# Patient Record
Sex: Male | Born: 1985 | Race: White | Hispanic: No | Marital: Single | Smoking: Current every day smoker
Health system: Southern US, Community
[De-identification: ages and names within clinical notes are randomized; demographics above are authoritative.]

## PROBLEM LIST (undated history)

## (undated) DIAGNOSIS — F1010 Alcohol abuse, uncomplicated: Secondary | ICD-10-CM

## (undated) DIAGNOSIS — J45909 Unspecified asthma, uncomplicated: Secondary | ICD-10-CM

## (undated) DIAGNOSIS — F1910 Other psychoactive substance abuse, uncomplicated: Secondary | ICD-10-CM

## (undated) DIAGNOSIS — E162 Hypoglycemia, unspecified: Secondary | ICD-10-CM

---

## 2010-04-12 LAB — POC CHEM8
Anion gap (POC): 17 mmol/L — ABNORMAL HIGH (ref 5–15)
BUN (POC): 4 MG/DL — ABNORMAL LOW (ref 9–20)
CO2 (POC): 22 MMOL/L (ref 21–32)
Calcium, ionized (POC): 1.18 MMOL/L (ref 1.12–1.32)
Chloride (POC): 106 MMOL/L (ref 98–107)
Creatinine (POC): 0.9 MG/DL (ref 0.6–1.3)
GFRAA, POC: >60 mL/min/{1.73_m2} (ref >60)
GFRNA, POC: >60 mL/min/{1.73_m2} (ref >60)
Glucose (POC): 94 MG/DL (ref 75–110)
Hematocrit (POC): 45 % (ref 36.6–50.3)
Hemoglobin (POC): 15.3 GM/DL (ref 12.1–17.0)
Potassium (POC): 3.7 MMOL/L (ref 3.5–5.1)
Sodium (POC): 140 MMOL/L (ref 137–145)

## 2010-04-12 LAB — CBC WITH AUTOMATED DIFF
ABS. BASOPHILS: 0.1 10*3/uL (ref 0.0–0.1)
ABS. EOSINOPHILS: 0.1 10*3/uL (ref 0.0–0.4)
ABS. LYMPHOCYTES: 1.5 10*3/uL (ref 0.8–3.5)
ABS. MONOCYTES: 0.5 10*3/uL (ref 0.0–1.0)
ABS. NEUTROPHILS: 2 10*3/uL (ref 1.8–8.0)
BASOPHILS: 2 % — ABNORMAL HIGH (ref 0–1)
EOSINOPHILS: 3 % (ref 0–7)
HCT: 42.6 % (ref 36.6–50.3)
HGB: 14.8 g/dL (ref 12.1–17.0)
LYMPHOCYTES: 37 % (ref 12–49)
MCH: 34.3 PG — ABNORMAL HIGH (ref 26.0–34.0)
MCHC: 34.7 g/dL (ref 30.0–36.5)
MCV: 98.6 FL (ref 80.0–99.0)
MONOCYTES: 12 % (ref 5–13)
NEUTROPHILS: 46 % (ref 32–75)
PLATELET: 281 10*3/uL (ref 150–400)
RBC: 4.32 M/uL (ref 4.10–5.70)
RDW: 13 % (ref 11.5–14.5)
WBC: 4.2 10*3/uL (ref 4.1–11.1)

## 2010-04-12 MED ORDER — ONDANSETRON 8 MG TAB, RAPID DISSOLVE
8 mg | ORAL_TABLET | Freq: Three times a day (TID) | ORAL | Status: AC | PRN
Start: 2010-04-12 — End: ?

## 2010-04-12 MED ADMIN — sodium chloride 0.9 % bolus infusion 1,000 mL: INTRAVENOUS | @ 15:00:00 | NDC 00409798348

## 2010-04-12 NOTE — ED Notes (Signed)
Verbal report received from Lindley, RN. Assumed patient care at this time.

## 2010-04-12 NOTE — ED Notes (Signed)
Patient verbalizes understanding of discharge instructions. Ambulatory and in no acute distress at discharge.
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
## 2010-04-13 LAB — EKG, 12 LEAD, INITIAL
Atrial Rate: 78 {beats}/min
Calculated P Axis: 57 degrees
Calculated R Axis: 82 degrees
Calculated T Axis: 55 degrees
Diagnosis: NORMAL
P-R Interval: 124 ms
Q-T Interval: 364 ms
QRS Duration: 100 ms
QTC Calculation (Bezet): 414 ms
Ventricular Rate: 78 {beats}/min

## 2012-05-04 ENCOUNTER — Other Ambulatory Visit: Payer: Self-pay | Admitting: Student

## 2012-05-04 ENCOUNTER — Ambulatory Visit
Admission: RE | Admit: 2012-05-04 | Discharge: 2012-05-04 | Disposition: A | Discharge status: Home / Self Care | Payer: No Typology Code available for payment source | Source: Ambulatory Visit | Attending: Student | Admitting: Student

## 2012-05-04 DIAGNOSIS — R7611 Nonspecific reaction to tuberculin skin test without active tuberculosis: Secondary | ICD-10-CM

## 2013-08-06 ENCOUNTER — Emergency Department (HOSPITAL_COMMUNITY)
Admission: EM | Admit: 2013-08-06 | Discharge: 2013-08-06 | Discharge status: Against Medical Advice | Payer: No Typology Code available for payment source

## 2013-08-06 ENCOUNTER — Emergency Department (HOSPITAL_COMMUNITY)
Admission: EM | Admit: 2013-08-06 | Discharge: 2013-08-07 | Disposition: A | Discharge status: Home / Self Care | Payer: Self-pay | Attending: Emergency Medicine | Admitting: Emergency Medicine

## 2013-08-06 ENCOUNTER — Encounter (HOSPITAL_COMMUNITY): Payer: Self-pay | Admitting: Emergency Medicine

## 2013-08-06 DIAGNOSIS — S20219A Contusion of unspecified front wall of thorax, initial encounter: Secondary | ICD-10-CM | POA: Insufficient documentation

## 2013-08-06 DIAGNOSIS — S8012XA Contusion of left lower leg, initial encounter: Secondary | ICD-10-CM

## 2013-08-06 DIAGNOSIS — S8010XA Contusion of unspecified lower leg, initial encounter: Secondary | ICD-10-CM | POA: Insufficient documentation

## 2013-08-06 DIAGNOSIS — F17200 Nicotine dependence, unspecified, uncomplicated: Secondary | ICD-10-CM | POA: Insufficient documentation

## 2013-08-06 DIAGNOSIS — R109 Unspecified abdominal pain: Secondary | ICD-10-CM | POA: Insufficient documentation

## 2013-08-06 DIAGNOSIS — S3981XA Other specified injuries of abdomen, initial encounter: Secondary | ICD-10-CM | POA: Insufficient documentation

## 2013-08-06 DIAGNOSIS — Z79899 Other long term (current) drug therapy: Secondary | ICD-10-CM | POA: Insufficient documentation

## 2013-08-06 DIAGNOSIS — S060X1A Concussion with loss of consciousness of 30 minutes or less, initial encounter: Secondary | ICD-10-CM | POA: Insufficient documentation

## 2013-08-06 DIAGNOSIS — Y9389 Activity, other specified: Secondary | ICD-10-CM | POA: Insufficient documentation

## 2013-08-06 DIAGNOSIS — S20212A Contusion of left front wall of thorax, initial encounter: Secondary | ICD-10-CM

## 2013-08-06 DIAGNOSIS — Y92410 Unspecified street and highway as the place of occurrence of the external cause: Secondary | ICD-10-CM | POA: Insufficient documentation

## 2013-08-06 DIAGNOSIS — J45909 Unspecified asthma, uncomplicated: Secondary | ICD-10-CM | POA: Insufficient documentation

## 2013-08-06 HISTORY — DX: Unspecified asthma, uncomplicated: J45.909
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
## 2013-08-07 ENCOUNTER — Encounter (HOSPITAL_COMMUNITY): Payer: Self-pay

## 2013-08-07 ENCOUNTER — Emergency Department (HOSPITAL_COMMUNITY): Payer: No Typology Code available for payment source

## 2013-08-07 ENCOUNTER — Emergency Department (HOSPITAL_COMMUNITY): Payer: Self-pay

## 2013-08-07 LAB — CBC WITH DIFFERENTIAL/PLATELET
BASOS PCT: 0 % (ref 0–1)
Basophils Absolute: 0 10*3/uL (ref 0.0–0.1)
EOS ABS: 0.1 10*3/uL (ref 0.0–0.7)
Eosinophils Relative: 1 % (ref 0–5)
HCT: 45.5 % (ref 39.0–52.0)
HEMOGLOBIN: 15.8 g/dL (ref 13.0–17.0)
Lymphocytes Relative: 15 % (ref 12–46)
Lymphs Abs: 1.7 10*3/uL (ref 0.7–4.0)
MCH: 34 pg (ref 26.0–34.0)
MCHC: 34.7 g/dL (ref 30.0–36.0)
MCV: 97.8 fL (ref 78.0–100.0)
MONOS PCT: 9 % (ref 3–12)
Monocytes Absolute: 1 10*3/uL (ref 0.1–1.0)
NEUTROS PCT: 75 % (ref 43–77)
Neutro Abs: 8.4 10*3/uL — ABNORMAL HIGH (ref 1.7–7.7)
PLATELETS: 273 10*3/uL (ref 150–400)
RBC: 4.65 MIL/uL (ref 4.22–5.81)
RDW: 12.6 % (ref 11.5–15.5)
WBC: 11.2 10*3/uL — ABNORMAL HIGH (ref 4.0–10.5)

## 2013-08-07 LAB — COMPREHENSIVE METABOLIC PANEL
ALBUMIN: 4.2 g/dL (ref 3.5–5.2)
ALK PHOS: 80 U/L (ref 39–117)
ALT: 42 U/L (ref 0–53)
ANION GAP: 16 — AB (ref 5–15)
AST: 69 U/L — ABNORMAL HIGH (ref 0–37)
BUN: 6 mg/dL (ref 6–23)
CO2: 22 mEq/L (ref 19–32)
Calcium: 9.2 mg/dL (ref 8.4–10.5)
Chloride: 104 mEq/L (ref 96–112)
Creatinine, Ser: 0.67 mg/dL (ref 0.50–1.35)
GFR calc Af Amer: >90 mL/min (ref >90)
GFR calc non Af Amer: >90 mL/min (ref >90)
Glucose, Bld: 116 mg/dL — ABNORMAL HIGH (ref 70–99)
POTASSIUM: 3.7 meq/L (ref 3.7–5.3)
Sodium: 142 mEq/L (ref 137–147)
Total Bilirubin: 0.4 mg/dL (ref 0.3–1.2)
Total Protein: 8.1 g/dL (ref 6.0–8.3)

## 2013-08-07 MED ORDER — METHOCARBAMOL 500 MG PO TABS: 500.0000 mg | ORAL_TABLET | Freq: Three times a day (TID) | ORAL | Status: AC | PRN

## 2013-08-07 MED ORDER — IBUPROFEN 600 MG PO TABS: 600.0000 mg | ORAL_TABLET | Freq: Four times a day (QID) | ORAL | Status: AC | PRN

## 2013-08-07 MED ORDER — MORPHINE SULFATE 4 MG/ML IJ SOLN
4.0000 mg | Freq: Once | INTRAMUSCULAR | Status: AC
  Administered 2013-08-07: 4 mg via INTRAVENOUS
  Filled 2013-08-07: qty 1

## 2013-08-07 MED ORDER — HYDROCODONE-ACETAMINOPHEN 5-325 MG PO TABS: 1.0000 | ORAL_TABLET | ORAL | Status: AC | PRN

## 2013-08-07 MED ORDER — SODIUM CHLORIDE 0.9 % IV BOLUS (SEPSIS)
1000.0000 mL | Freq: Once | INTRAVENOUS | Status: AC
  Administered 2013-08-07: 1000 mL via INTRAVENOUS

## 2013-08-07 MED ORDER — IOHEXOL 300 MG/ML  SOLN
100.0000 mL | Freq: Once | INTRAMUSCULAR | Status: AC | PRN
  Administered 2013-08-07: 100 mL via INTRAVENOUS
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
## 2013-08-07 NOTE — ED Notes (Signed)
Patient transported to X-ray 

## 2013-08-07 NOTE — ED Notes (Signed)
Dr Yelverton at bedside.  
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
## 2014-04-15 ENCOUNTER — Encounter (HOSPITAL_COMMUNITY): Payer: Self-pay | Admitting: Emergency Medicine

## 2014-04-15 ENCOUNTER — Emergency Department (HOSPITAL_COMMUNITY)
Admission: EM | Admit: 2014-04-15 | Discharge: 2014-04-15 | Disposition: A | Discharge status: Home / Self Care | Payer: Self-pay | Attending: Emergency Medicine | Admitting: Emergency Medicine

## 2014-04-15 DIAGNOSIS — Z79899 Other long term (current) drug therapy: Secondary | ICD-10-CM | POA: Insufficient documentation

## 2014-04-15 DIAGNOSIS — F10929 Alcohol use, unspecified with intoxication, unspecified: Secondary | ICD-10-CM

## 2014-04-15 DIAGNOSIS — T424X1A Poisoning by benzodiazepines, accidental (unintentional), initial encounter: Secondary | ICD-10-CM | POA: Insufficient documentation

## 2014-04-15 DIAGNOSIS — F10129 Alcohol abuse with intoxication, unspecified: Secondary | ICD-10-CM | POA: Insufficient documentation

## 2014-04-15 DIAGNOSIS — J45909 Unspecified asthma, uncomplicated: Secondary | ICD-10-CM | POA: Insufficient documentation

## 2014-04-15 DIAGNOSIS — Z720 Tobacco use: Secondary | ICD-10-CM | POA: Insufficient documentation

## 2014-04-15 LAB — RAPID URINE DRUG SCREEN, HOSP PERFORMED
AMPHETAMINES: NOT DETECTED
BARBITURATES: NOT DETECTED
Benzodiazepines: POSITIVE — AB
COCAINE: NOT DETECTED
Opiates: NOT DETECTED
Tetrahydrocannabinol: POSITIVE — AB

## 2014-04-15 LAB — ETHANOL: Alcohol, Ethyl (B): 211 mg/dL — ABNORMAL HIGH (ref 0–9)

## 2014-04-15 LAB — CBC
HCT: 45.7 % (ref 39.0–52.0)
HEMOGLOBIN: 16.1 g/dL (ref 13.0–17.0)
MCH: 33.6 pg (ref 26.0–34.0)
MCHC: 35.2 g/dL (ref 30.0–36.0)
MCV: 95.4 fL (ref 78.0–100.0)
Platelets: 320 10*3/uL (ref 150–400)
RBC: 4.79 MIL/uL (ref 4.22–5.81)
RDW: 12 % (ref 11.5–15.5)
WBC: 5.4 10*3/uL (ref 4.0–10.5)

## 2014-04-15 LAB — COMPREHENSIVE METABOLIC PANEL
ALT: 18 U/L (ref 0–53)
AST: 22 U/L (ref 0–37)
Albumin: 4 g/dL (ref 3.5–5.2)
Alkaline Phosphatase: 62 U/L (ref 39–117)
Anion gap: 11 (ref 5–15)
CALCIUM: 9 mg/dL (ref 8.4–10.5)
CO2: 23 mmol/L (ref 19–32)
CREATININE: 0.71 mg/dL (ref 0.50–1.35)
Chloride: 110 mmol/L (ref 96–112)
GFR calc Af Amer: >90 mL/min (ref >90)
GFR calc non Af Amer: >90 mL/min (ref >90)
GLUCOSE: 104 mg/dL — AB (ref 70–99)
POTASSIUM: 3.6 mmol/L (ref 3.5–5.1)
Sodium: 144 mmol/L (ref 135–145)
Total Bilirubin: 0.6 mg/dL (ref 0.3–1.2)
Total Protein: 7.4 g/dL (ref 6.0–8.3)

## 2014-04-15 LAB — SALICYLATE LEVEL: Salicylate Lvl: <4 mg/dL (ref 2.8–20.0)

## 2014-04-15 LAB — ACETAMINOPHEN LEVEL: Acetaminophen (Tylenol), Serum: <10 ug/mL — ABNORMAL LOW (ref 10–30)

## 2014-04-15 MED ORDER — NALOXONE HCL 1 MG/ML IJ SOLN
1.0000 mg | Freq: Once | INTRAMUSCULAR | Status: AC
  Administered 2014-04-15: 2 mg via INTRAVENOUS
  Administered 2014-04-15: 1 mg via INTRAVENOUS

## 2014-04-15 MED ORDER — NALOXONE HCL 1 MG/ML IJ SOLN
INTRAMUSCULAR | Status: AC
  Filled 2014-04-15: qty 2

## 2014-04-15 MED ORDER — NALOXONE HCL 1 MG/ML IJ SOLN
1.0000 mg | Freq: Once | INTRAMUSCULAR | Status: AC
  Administered 2014-04-15: 1 mg via INTRAVENOUS

## 2014-04-15 MED ORDER — NALOXONE HCL 1 MG/ML IJ SOLN
INTRAMUSCULAR | Status: AC
  Filled 2014-04-15: qty 4

## 2014-04-15 MED ORDER — NALOXONE HCL 1 MG/ML IJ SOLN
2.0000 mg | Freq: Once | INTRAMUSCULAR | Status: AC
  Administered 2014-04-15: 2 mg via INTRAVENOUS

## 2014-04-15 MED ORDER — NALOXONE HCL 1 MG/ML IJ SOLN
2.0000 mg | Freq: Once | INTRAMUSCULAR | Status: AC
  Administered 2014-04-15: 2 mg via INTRAVENOUS
  Filled 2014-04-15: qty 2

## 2014-04-15 MED ORDER — NALOXONE HCL 1 MG/ML IJ SOLN
INTRAMUSCULAR | Status: AC
  Administered 2014-04-15: 2 mg via INTRAVENOUS
  Filled 2014-04-15: qty 2

## 2014-04-15 NOTE — ED Notes (Signed)
Pt states that he has a ride home and that they should be here in approx 20 minutes.

## 2014-04-15 NOTE — Discharge Instructions (Signed)
Accidental Overdose °A drug overdose occurs when a chemical substance (drug or medication) is used in amounts large enough to overcome a person. This may result in severe illness or death. This is a type of poisoning. Accidental overdoses of medications or other substances come from a variety of reasons. When this happens accidentally, it is often because the person taking the substance does not know enough about what they have taken. Drugs which commonly cause overdose deaths are alcohol, psychotropic medications (medications which affect the mind), pain medications, illegal drugs (street drugs) such as cocaine and heroin, and multiple drugs taken at the same time. It may result from careless behavior (such as over-indulging at a party). Other causes of overdose may include multiple drug use, a lapse in memory, or drug use after a period of no drug use.  °Sometimes overdosing occurs because a person cannot remember if they have taken their medication.  °A common unintentional overdose in young children involves multi-vitamins containing iron. Iron is a part of the hemoglobin molecule in blood. It is used to transport oxygen to living cells. When taken in small amounts, iron allows the body to restock hemoglobin. In large amounts, it causes problems in the body. If this overdose is not treated, it can lead to death. °Never take medicines that show signs of tampering or do not seem quite right. Never take medicines in the dark or in poor lighting. Read the label and check each dose of medicine before you take it. When adults are poisoned, it happens most often through carelessness or lack of information. Taking medicines in the dark or taking medicine prescribed for someone else to treat the same type of problem is a dangerous practice. °SYMPTOMS  °Symptoms of overdose depend on the medication and amount taken. They can vary from over-activity with stimulant over-dosage, to sleepiness from depressants such as  alcohol, narcotics and tranquilizers. Confusion, dizziness, nausea and vomiting may be present. If problems are severe enough coma and death may result. °DIAGNOSIS  °Diagnosis and management are generally straightforward if the drug is known. Otherwise it is more difficult. At times, certain symptoms and signs exhibited by the patient, or blood tests, can reveal the drug in question.  °TREATMENT  °In an emergency department, most patients can be treated with supportive measures. Antidotes may be available if there has been an overdose of opioids or benzodiazepines. A rapid improvement will often occur if this is the cause of overdose. °At home or away from medical care: °· There may be no immediate problems or warning signs in children. °· Not everything works well in all cases of poisoning. °· Take immediate action. Poisons may act quickly. °· If you think someone has swallowed medicine or a household product, and the person is unconscious, having seizures (convulsions), or is not breathing, immediately call for an ambulance. °IF a person is conscious and appears to be doing OK but has swallowed a poison: °· Do not wait to see what effect the poison will have. Immediately call a poison control center (listed in the white pages of your telephone book under "Poison Control" or inside the front cover with other emergency numbers). Some poison control centers have TTY capability for the deaf. Check with your local center if you or someone in your family requires this service. °· Keep the container so you can read the label on the product for ingredients. °· Describe what, when, and how much was taken and the age and condition of the person poisoned.   Inform them if the person is vomiting, choking, drowsy, shows a change in color or temperature of skin, is conscious or unconscious, or is convulsing.  Do not cause vomiting unless instructed by medical personnel. Do not induce vomiting or force liquids into a person who  is convulsing, unconscious, or very drowsy. Stay calm and in control.   Activated charcoal also is sometimes used in certain types of poisoning and you may wish to add a supply to your emergency medicines. It is available without a prescription. Call a poison control center before using this medication. PREVENTION  Thousands of children die every year from unintentional poisoning. This may be from household chemicals, poisoning from carbon monoxide in a car, taking their parent's medications, or simply taking a few iron pills or vitamins with iron. Poisoning comes from unexpected sources.  Store medicines out of the sight and reach of children, preferably in a locked cabinet. Do not keep medications in a food cabinet. Always store your medicines in a secure place. Get rid of expired medications.  If you have children living with you or have them as occasional guests, you should have child-resistant caps on your medicine containers. Keep everything out of reach. Child proof your home.  If you are called to the telephone or to answer the door while you are taking a medicine, take the container with you or put the medicine out of the reach of small children.  Do not take your medication in front of children. Do not tell your child how good a medication is and how good it is for them. They may get the idea it is more of a treat.  If you are an adult and have accidentally taken an overdose, you need to consider how this happened and what can be done to prevent it from happening again. If this was from a street drug or alcohol, determine if there is a problem that needs addressing. If you are not sure a problems exists, it is easy to talk to a professional and ask them if they think you have a problem. It is better to handle this problem in this way before it happens again and has a much worse consequence. Document Released: 03/27/2004 Document Revised: 04/05/2011 Document Reviewed: 09/02/2008 [facility]  Patient Information 2015 [place], [place]. This information is not intended to replace advice given to you by your health care provider. Make sure you discuss any questions you have with your health care provider.  Alcohol Use Disorder Alcohol use disorder is a mental disorder. It is not a one-time incident of heavy drinking. Alcohol use disorder is the excessive and uncontrollable use of alcohol over time that leads to problems with functioning in one or more areas of daily living. People with this disorder risk harming themselves and others when they drink to excess. Alcohol use disorder also can cause other mental disorders, such as mood and anxiety disorders, and serious physical problems. People with alcohol use disorder often misuse other drugs.  Alcohol use disorder is common and widespread. Some people with this disorder drink alcohol to cope with or escape from negative life events. Others drink to relieve chronic pain or symptoms of mental illness. People with a family history of alcohol use disorder are at higher risk of losing control and using alcohol to excess.  SYMPTOMS  Signs and symptoms of alcohol use disorder may include the following:   Consumption ofalcohol inlarger amounts or over a longer period of time than intended.  Multiple unsuccessful  attempts to cutdown or control alcohol use.   A great deal of time spent obtaining alcohol, using alcohol, or recovering from the effects of alcohol (hangover).  A strong desire or urge to use alcohol (cravings).   Continued use of alcohol despite problems at work, school, or home because of alcohol use.   Continued use of alcohol despite problems in relationships because of alcohol use.  Continued use of alcohol in situations when it is physically hazardous, such as driving a car.  Continued use of alcohol despite awareness of a physical or psychological problem that is likely related to alcohol use. Physical problems related to  alcohol use can involve the brain, heart, liver, stomach, and intestines. Psychological problems related to alcohol use include intoxication, depression, anxiety, psychosis, delirium, and dementia.   The need for increased amounts of alcohol to achieve the same desired effect, or a decreased effect from the consumption of the same amount of alcohol (tolerance).  Withdrawal symptoms upon reducing or stopping alcohol use, or alcohol use to reduce or avoid withdrawal symptoms. Withdrawal symptoms include:  Racing heart.  Hand tremor.  Difficulty sleeping.  Nausea.  Vomiting.  Hallucinations.  Restlessness.  Seizures. DIAGNOSIS Alcohol use disorder is diagnosed through an assessment by your health care provider. Your health care provider may start by asking three or four questions to screen for excessive or problematic alcohol use. To confirm a diagnosis of alcohol use disorder, at least two symptoms must be present within a 5074-month period. The severity of alcohol use disorder depends on the number of symptoms:  Mild--two or three.  Moderate--four or five.  Severe--six or more. Your health care provider may perform a physical exam or use results from lab tests to see if you have physical problems resulting from alcohol use. Your health care provider may refer you to a mental health professional for evaluation. TREATMENT  Some people with alcohol use disorder are able to reduce their alcohol use to low-risk levels. Some people with alcohol use disorder need to quit drinking alcohol. When necessary, mental health professionals with specialized training in substance use treatment can help. Your health care provider can help you decide how severe your alcohol use disorder is and what type of treatment you need. The following forms of treatment are available:   Detoxification. Detoxification involves the use of prescription medicines to prevent alcohol withdrawal symptoms in the first week  after quitting. This is important for people with a history of symptoms of withdrawal and for heavy drinkers who are likely to have withdrawal symptoms. Alcohol withdrawal can be dangerous and, in severe cases, cause death. Detoxification is usually provided in a hospital or in-patient substance use treatment facility.  Counseling or talk therapy. Talk therapy is provided by substance use treatment counselors. It addresses the reasons people use alcohol and ways to keep them from drinking again. The goals of talk therapy are to help people with alcohol use disorder find healthy activities and ways to cope with life stress, to identify and avoid triggers for alcohol use, and to handle cravings, which can cause relapse.  Medicines.Different medicines can help treat alcohol use disorder through the following actions:  Decrease alcohol cravings.  Decrease the positive reward response felt from alcohol use.  Produce an uncomfortable physical reaction when alcohol is used (aversion therapy).  Support groups. Support groups are run by people who have quit drinking. They provide emotional support, advice, and guidance. These forms of treatment are often combined. Some people with alcohol  use disorder benefit from intensive combination treatment provided by specialized substance use treatment centers. Both inpatient and outpatient treatment programs are available. Document Released: 02/19/2004 Document Revised: 05/28/2013 Document Reviewed: 04/20/2012 [facility] Patient Information 2015 [place], [place]. This information is not intended to replace advice given to you by your health care provider. Make sure you discuss any questions you have with your health care provider. Substance Abuse Treatment Programs  Intensive Outpatient Programs [facility]     [address]      [place], [place]                   [phone]       [facility] [address] [place]  [address] [place], [place] [phone]  [facility]     (Inpatient and outpatient)     [address]           [phone]    [facility] [phone] (Suboxone and Methadone)  [address]      [place], [place] [address]      [phone]       [address] [place], [place] [phone]  [facility] (Outpatient/Inpatient, Chemical)    (insurance only) [phone]             [facility] (Groups & Residential) [place], [place] [phone]     [facility]     [address]     [place], [place]      [phone]       [facility] (for caregivers and family) [phone] [address] [place], [place] [phone]      Residential Treatment Programs [facility]      [address], [place], [place] [address]  [phone]       [facility] [address] [place], [place] [address] [phone]  [facility] [place]        [phone]       [facility] [phone]  [facility] ([facility]             [address]                                         [place], [place]                                                [phone] or [phone]                               [facility] [place] [address] [place], [address] [phone]  [facility]    [address]      [place], [place]     [phone]       [facility] [address] [place], [place] [phone]  [facility]   [address] [place], [place] [address]     [phone]      Admissions: 8am-3pm M-F  [facility] ([facility]) [address] [place], [place] [phone]  [facility]: Residential Program [phone] Days)   [place], [place]      [phone] or  [phone]     [facility]: [facility] [place], [place] (Walk in Hours over the weekend or by referral)  [facility] [address] [place], [place], [place] [address] [phone]  Crisis Mobile: [facility]:  [phone] (for crisis response 24 hours a day) [facility] Hotline:      [phone] Outpatient Psychiatry and Counseling  [facility]: Mobile Crisis Management 24 hours:  [phone]  [facility] of the [employer] sliding scale fee and walk in schedule: M-F 8am-12pm/1pm-3pm [address]  [place], [place] [address] [phone]  [facility] [address] [place], [place] [address] [phone]  [facility] ([facility])- new patient walk-in appointments available Monday - Friday 8am -3pm.          [address] [place], [place] [address] [phone] or crisis line- [phone]  [facility] Services/ Intensive Outpatient Therapy Program [address] [place], [place] [address] [phone]  [facility]                  Crisis Services      [phone] [address]     [place], [place] [address]                 [facility]   [facility] [phone]. [address] [place], [place] [address]   [employer] [employer] of Care          [address] [place]  [place], [place] [address]       [phone]  [facility] [address] [place], [place] [address] [phone]  [facility]    [address]    [place], [place] [address]     [phone]       Andee Poles, MD     [address]     [place] [address]     [phone]       [facility] [address] [place] [address]  [facility]     [address] [place], [place]      [phone]       [facility] Eulogio Ditch, MD [address] [place], [place]  [address] [phone]  [facility]     [address]     [place], [place] [address]     [phone]       [facility] [address] [place], [place] [address] [phone] Resources for Temporary Residential Assistance/Crisis Centers  DAY CENT[facility]) M-F 8am-3pm   [address] [place], [place] [address]   [phone] Services include: laundry, barbering, support groups, case management, phone  & computer access, showers, AA/NA mtgs, mental health/substance abuse nurse, job skills class, disability information, VA assistance, spiritual classes, etc.   HOMELESS SHELTERS  [facility]     [facility]   [address] [place]     [phone]              [employer] (women and children)       [address] [place], [place] [address] [phone] [email or web address] for application and process Application Required  [facility]   [address]    [place] [address]  [phone]                    [facility] [address] [place], [place] [address] [phone] application appt.) Application Required  [facility] (women only)    [address]     [place], [place] [address]     [phone]      Intake starts 6pm daily Need valid ID, SSC, & Police report [employer] [address] [place], [place] [phone] Application Required  [employer] (men only)     [address]      [place], [place]     [phone]       [facility] [place] (Pregnant women only) [address] [place], [place] [phone]  [facility]      [address]      [place], [place] [address]     [phone]             [facility] [address] [place], [place] [phone] 90 day commitment/SA/Application process  [facility](men only)     [address]     [place], [place]     [phone]       Check-in at [facility] [address] [place], [place] [address] [phone] Men/Women/Women and Children must be there by 7 pm  [facility] [place], [place] [phone]

## 2014-04-15 NOTE — ED Notes (Signed)
Pt is adamant about going home. Pt states that there is no one to pick him up from the hospital. This RN explained to the pt that it is not safe for him to go home by himself. Pt acknowledges.

## 2014-04-15 NOTE — ED Notes (Signed)
Rocky LinkKen, EMT at bedside.

## 2014-04-15 NOTE — ED Notes (Signed)
Pt has left with his ride home. Pt given discharge instructions, and had psychiatric services discussed.

## 2014-04-15 NOTE — ED Notes (Signed)
This RN ordered a regular, no sharps tray for the pt.  

## 2014-04-15 NOTE — ED Provider Notes (Signed)
CSN: [id number]     Arrival date & time 04/15/14  1637 History   First MD Initiated Contact with Patient 04/15/14 1639     Chief Complaint  Patient presents with  . Drug Overdose   Daniel Mcdonald is a 29 y.o. male who presents to the emergency department by EMS report he was found unresponsive by his roommate today. Upon my examination the patient is aroused by light touch and tells me he took 3 Xanax today. He also reports marijuana use. He denies any use of alcohol or other illicit substances today. He tells me he is upset as his brother is going to die soon. According to EMS is fair sick blood sugar was 79. He was given three rounds of narcan by EMS with minimal response. He is confused about where he is and what is going on today. He does deny trying to hurt himself today.  His mothers name is Johnnette Litter and her phone number is [phone].  Mother, who lives in [place], reports that she spoke with a roommate who reports there was a blister pack of Etizolam in his room.  Later I spoke with another roommate who tells me he drank alcohol and took several xanax today.   (Consider location/radiation/quality/duration/timing/severity/associated sxs/prior Treatment) HPI  Past Medical History  Diagnosis Date  . Asthma    History reviewed. No pertinent past surgical history. No family history on file. History  Substance Use Topics  . Smoking status: Current Every Day Smoker  . Smokeless tobacco: Not on file  . Alcohol Use: Yes    Review of Systems  Unable to perform ROS: Mental status change  Gastrointestinal: Negative for abdominal pain.  Psychiatric/Behavioral: Positive for dysphoric mood. Negative for suicidal ideas.      Allergies  Vancomycin  Home Medications   Prior to Admission medications   Medication Sig Start Date End Date Taking? Authorizing Provider  alprazolam Prudy Feeler) 2 MG tablet Take 2 mg by mouth 3 (three) times daily as needed for anxiety (FOR ptsd).   Yes  Historical Provider, MD  HYDROcodone-acetaminophen (NORCO) 5-325 MG per tablet Take 1-2 tablets by mouth every 4 (four) hours as needed for moderate pain. 08/07/13   Loren Racer, MD  ibuprofen (ADVIL,MOTRIN) 600 MG tablet Take 1 tablet (600 mg total) by mouth every 6 (six) hours as needed. 08/07/13   Loren Racer, MD  methocarbamol (ROBAXIN) 500 MG tablet Take 1 tablet (500 mg total) by mouth every 8 (eight) hours as needed for muscle spasms. 08/07/13   Loren Racer, MD  tetrahydrozoline 0.05 % ophthalmic solution Place 2 drops into both eyes daily.    Historical Provider, MD   BP 122/97 mmHg  Pulse 74  Temp(Src) 98.9 F (37.2 C) (Oral)  Resp 13  SpO2 98% Physical Exam  Constitutional: He appears well-developed and well-nourished. No distress.  Sleeping in room and aroused by light touch.   HENT:  Head: Normocephalic and atraumatic.  Right Ear: External ear normal.  Left Ear: External ear normal.  Nose: Nose normal.  Mouth/Throat: Oropharynx is clear and moist. No oropharyngeal exudate.  Bilateral tonsillar hypertrophy without exudate.   Eyes: Conjunctivae are normal. Pupils are equal, round, and reactive to light. Right eye exhibits no discharge. Left eye exhibits no discharge.  Pupils are not pinpoint.   Neck: Normal range of motion. Neck supple.  Cardiovascular: Normal rate, regular rhythm, normal heart sounds and intact distal pulses.  Exam reveals no gallop and no friction rub.   No  murmur heard. Pulmonary/Chest: Effort normal and breath sounds normal. No respiratory distress. He has no wheezes. He has no rales.  Abdominal: Soft. There is no tenderness.  Musculoskeletal: He exhibits no edema.  Lymphadenopathy:    He has no cervical adenopathy.  Neurological: He is alert. No cranial nerve deficit. Coordination normal.  Oriented to person only. Aroused by light touch and follows commands.   Skin: Skin is warm and dry. No rash noted. He is not diaphoretic. No erythema. No  pallor.  Psychiatric: He has a normal mood and affect. His behavior is normal.  Nursing note and vitals reviewed.   ED Course  Procedures (including critical care time) Labs Review Labs Reviewed  COMPREHENSIVE METABOLIC PANEL - Abnormal; Notable for the following:    Glucose, Bld 104 (*)    BUN <5 (*)    All other components within normal limits  ETHANOL - Abnormal; Notable for the following:    Alcohol, Ethyl (B) 211 (*)    All other components within normal limits  URINE RAPID DRUG SCREEN (HOSP PERFORMED) - Abnormal; Notable for the following:    Benzodiazepines POSITIVE (*)    Tetrahydrocannabinol POSITIVE (*)    All other components within normal limits  ACETAMINOPHEN LEVEL - Abnormal; Notable for the following:    Acetaminophen (Tylenol), Serum <10.0 (*)    All other components within normal limits  CBC  SALICYLATE LEVEL    Imaging Review No results found.   EKG Interpretation None     EKG:  Date: 04/16/2014 Rate: 78 Rhythm: normal sinus rhythm QRS Axis: normal Intervals: normal ST/T Wave abnormalities: ST elevations anteriorly, likely early repolarization pattern Conduction Disutrbances:none Narrative Interpretation:  Old EKG Reviewed: none available I have personally reviewed the EKG tracing and agree with the computerized printout as noted. Rolan BuccoMelanie Belfi, MD  Filed Vitals:   04/15/14 2030 04/15/14 2045 04/15/14 2149 04/15/14 2307  BP: 103/78 122/74 127/79 122/97  Pulse: 83 79 74   Temp:   98.9 F (37.2 C)   TempSrc:   Oral   Resp: 18 16 13    SpO2: 95% 96% 98%      MDM   Meds given in ED:  Medications  naloxone (NARCAN) injection 1 mg (2 mg Intravenous Given 04/15/14 1757)  naloxone (NARCAN) injection 1 mg (1 mg Intravenous Given 04/15/14 1745)  naloxone [facility](NARCAN) injection 2 mg (2 mg Intravenous Given 04/15/14 1749)  naloxone [facility](NARCAN) injection 2 mg (2 mg Intravenous Given 04/15/14 1800)  naloxone (NARCAN) injection 2 mg (2 mg Intravenous  Given 04/15/14 1815)  naloxone (NARCAN) injection 2 mg (2 mg Intravenous Given 04/15/14 1830)    Discharge Medication List as of 04/15/2014  9:57 PM      Final diagnoses:  Benzodiazepine (tranquilizer) overdose, accidental or unintentional, initial encounter  Alcohol intoxication, with unspecified complication   This 29 year old male who presented to the emergency room by EMS with altered mental status. EMS reports he was found by his roommate unresponsive on the floor. The patient was given 3 rounds of Narcan with minimal relief. Upon arrival to demonstrate the patient became responsive and at my initial evaluation the patient is arousable to light touch and tells me he took 3 xanax today, but denies any thoughts about wanting to hurt himself. His pupils are not pinpoint. He is in no respiratory distress and is not hypoxic. He is confused about where he is at my initial evaluation and appears intoxicated.  I spoke with his mother who is unsure what he  might have taken but did speak with the roommate who reports benzodiazepines. I also spoke with poison control about the specific benzodiazepine he might have taken who suggested supportive care, and blood work that had already been ordered.  Around 1745 I was notified that the patient became less arousable. The patient would no longer follow commands and his pupils are pinpoint. He is still tolerating his secretions and has a gag reflex. Tried several rounds of narcan without any change. He had 10 mg total of narcan without change. I placed a nasal trumpet and he quickly became more responsive.  Urine rapid drug screen was positive for benzodiazepines and THC. He has an alcohol level of 211. His CBC and CMP are unremarkable. His acetaminophen and salicylate level are negative. I spoke again with the patient when he was more awake and he is clearly still intoxicated. Will allow him to sober up and speak with him again. He denies SI or HI. This seems to be  due to use of benzodiazepines with alcohol.  When the patient had sobered up I spoke with the patient again who denies SI or HI. He reports he was in an argument with his roommate and went to the bar to drink. He reports taking benzodiazepines to "try and get high." But he did not intent to hurt himself. He reports some depression and history of PTSD, but denies SI. I suggested he stay and speak with behavioural health, but he refuses. I spoke to him about [facility] resources and provided him information in his discharge. He is alert and oriented at this time. He has no focal neuro deficits. He denies any complaint at this time. He is able to ambulate without difficulty or assistance. He does not want me to speak with his mother again.  After discussing with my attending we will allow him to be discharged to the care of his roommates who are at bedside. Strict return precautions are provided.  I advised the patient to follow-up with their primary care provider this week. I advised the patient to return to the emergency department with new or worsening symptoms or new concerns. The patient verbalized understanding and agreement with plan.   This patient was discussed with and evaluated by Dr. Fredderick Phenix who agrees with assessment and plan.   CRITICAL CARE Performed by: Lawana Chambers   Total critical care time: 60  Critical care time was exclusive of separately billable procedures and treating other patients.  Critical care was necessary to treat or prevent imminent or life-threatening deterioration.  Critical care was time spent personally by me on the following activities: development of treatment plan with patient and/or surrogate as well as nursing, discussions with consultants, evaluation of patient's response to treatment, examination of patient, obtaining history from patient or surrogate, ordering and performing treatments and interventions, ordering and review of laboratory studies, ordering and  review of radiographic studies, pulse oximetry and re-evaluation of patient's condition.    Everlene Farrier, PA-C 04/16/14 1610  Rolan Bucco, MD 04/16/14 1309

## 2014-04-15 NOTE — ED Notes (Addendum)
Pt reporting that he is missing a leather jacket and his wallet. This RN has not seen either of these items. Pt also reporting that he has lost his keys.

## 2014-04-15 NOTE — ED Notes (Addendum)
[employer]by, friends, at bedside. Report patient took "At least 7 Xanax Bars, a half of a fifth of liquor, and was drinking beer all morning." Dr. Fredderick PhenixBelfi made aware.

## 2014-04-15 NOTE — ED Notes (Signed)
Pt is awake. Pt heard shouting from the nurse's station. This RN went to check on the pt, and asked why he was yelling, and the pt stated "Because it's perfectly within my right to do so". Pt appears in no acute distress but is still confused. Pt asked this RN what her "rank" is.

## 2014-04-15 NOTE — ED Notes (Addendum)
Per EMS, pt was found by roommate unresponsive. Pts roommate reports that pt does do benzos and mariajuana. Pt was given narcan by the fire department with no response. Pt then given 2 more rounds of narcan by ems with minimal response. Pt now responds to voice but is confused. Pt has nasal trumpet in place.  BP:136/70, CBG:79

## 2014-04-15 NOTE — ED Notes (Signed)
Pt pulled out his nasal trumpet. Pt verbally abusive. Pt does not admit to drug use today, but his roommate reports he took xanax and marijuana. Pt confused when speaking to this RN. Pt states" we went through a seven year campaign together." Pt talking about war and the government.

## 2014-04-15 NOTE — ED Notes (Signed)
Will PA at bedside, inserting NPA.

## 2014-04-15 NOTE — ED Notes (Signed)
Pt is now reporting bilateral knee pain, worse with movement. Pt is requesting morphine for pain. This RN explained to the pt that he will be unable to have morphine, as it is a sedative, and he has already taken too much xanax.  Pt has taken all of the sheet off of his bed. Meal tray at bedside.

## 2014-04-15 NOTE — ED Notes (Signed)
This RN spoke with Alona BeneJoyce in poison control, who recommends that the pt should receive IV fluids and have an EKG performed.

## 2014-04-15 NOTE — ED Notes (Signed)
Patient self-extracated NPA.

## 2014-04-15 NOTE — ED Notes (Signed)
Pt has taken all of the vitals monitoring equipment off and has taken out his second IV. Will, PA at bedside.

## 2014-04-16 NOTE — ED Provider Notes (Signed)
ED ECG REPORT   Date: 04/16/2014  Rate: 78  Rhythm: normal sinus rhythm  QRS Axis: normal  Intervals: normal  ST/T Wave abnormalities: ST elevations anteriorly, likely early repolarization pattern  Conduction Disutrbances:none  Narrative Interpretation:   Old EKG Reviewed: none available  I have personally reviewed the EKG tracing and agree with the computerized printout as noted.   Rolan BuccoMelanie Tonette Koehne, MD 04/16/14 [phone]

## 2014-04-26 ENCOUNTER — Encounter (HOSPITAL_COMMUNITY): Payer: Self-pay | Admitting: *Deleted

## 2014-04-26 ENCOUNTER — Emergency Department (HOSPITAL_COMMUNITY)
Admission: EM | Admit: 2014-04-26 | Discharge: 2014-04-27 | Disposition: A | Discharge status: Home / Self Care | Payer: Self-pay | Attending: Emergency Medicine | Admitting: Emergency Medicine

## 2014-04-26 ENCOUNTER — Emergency Department (HOSPITAL_COMMUNITY): Payer: Self-pay

## 2014-04-26 DIAGNOSIS — J45909 Unspecified asthma, uncomplicated: Secondary | ICD-10-CM | POA: Insufficient documentation

## 2014-04-26 DIAGNOSIS — S01311A Laceration without foreign body of right ear, initial encounter: Secondary | ICD-10-CM | POA: Insufficient documentation

## 2014-04-26 DIAGNOSIS — Z23 Encounter for immunization: Secondary | ICD-10-CM | POA: Insufficient documentation

## 2014-04-26 DIAGNOSIS — S0990XA Unspecified injury of head, initial encounter: Secondary | ICD-10-CM | POA: Insufficient documentation

## 2014-04-26 DIAGNOSIS — Y998 Other external cause status: Secondary | ICD-10-CM | POA: Insufficient documentation

## 2014-04-26 DIAGNOSIS — Y92002 Bathroom of unspecified non-institutional (private) residence single-family (private) house as the place of occurrence of the external cause: Secondary | ICD-10-CM | POA: Insufficient documentation

## 2014-04-26 DIAGNOSIS — F1910 Other psychoactive substance abuse, uncomplicated: Secondary | ICD-10-CM

## 2014-04-26 DIAGNOSIS — F1210 Cannabis abuse, uncomplicated: Secondary | ICD-10-CM | POA: Insufficient documentation

## 2014-04-26 DIAGNOSIS — S0101XA Laceration without foreign body of scalp, initial encounter: Secondary | ICD-10-CM | POA: Insufficient documentation

## 2014-04-26 DIAGNOSIS — W1839XA Other fall on same level, initial encounter: Secondary | ICD-10-CM | POA: Insufficient documentation

## 2014-04-26 DIAGNOSIS — Y9389 Activity, other specified: Secondary | ICD-10-CM | POA: Insufficient documentation

## 2014-04-26 DIAGNOSIS — Z720 Tobacco use: Secondary | ICD-10-CM | POA: Insufficient documentation

## 2014-04-26 DIAGNOSIS — S0181XA Laceration without foreign body of other part of head, initial encounter: Secondary | ICD-10-CM

## 2014-04-26 HISTORY — DX: Hypoglycemia, unspecified: E16.2

## 2014-04-26 HISTORY — DX: Other psychoactive substance abuse, uncomplicated: F19.10

## 2014-04-26 HISTORY — DX: Alcohol abuse, uncomplicated: F10.10

## 2014-04-26 LAB — CBC WITH DIFFERENTIAL/PLATELET
BASOS ABS: 0.1 10*3/uL (ref 0.0–0.1)
BASOS PCT: 0 % (ref 0–1)
EOS ABS: 0 10*3/uL (ref 0.0–0.7)
Eosinophils Relative: 0 % (ref 0–5)
HCT: 46.9 % (ref 39.0–52.0)
HEMOGLOBIN: 17.1 g/dL — AB (ref 13.0–17.0)
LYMPHS PCT: 5 % — AB (ref 12–46)
Lymphs Abs: 1.2 10*3/uL (ref 0.7–4.0)
MCH: 34.2 pg — ABNORMAL HIGH (ref 26.0–34.0)
MCHC: 36.5 g/dL — ABNORMAL HIGH (ref 30.0–36.0)
MCV: 93.8 fL (ref 78.0–100.0)
MONO ABS: 1.4 10*3/uL — AB (ref 0.1–1.0)
Monocytes Relative: 6 % (ref 3–12)
NEUTROS ABS: 19.7 10*3/uL — AB (ref 1.7–7.7)
NEUTROS PCT: 89 % — AB (ref 43–77)
Platelets: 325 10*3/uL (ref 150–400)
RBC: 5 MIL/uL (ref 4.22–5.81)
RDW: 12.1 % (ref 11.5–15.5)
WBC: 22.3 10*3/uL — AB (ref 4.0–10.5)

## 2014-04-26 LAB — BASIC METABOLIC PANEL
ANION GAP: 8 (ref 5–15)
Anion gap: 21 — ABNORMAL HIGH (ref 5–15)
BUN: 8 mg/dL (ref 6–23)
BUN: 8 mg/dL (ref 6–23)
CALCIUM: 9.6 mg/dL (ref 8.4–10.5)
CHLORIDE: 100 mmol/L (ref 96–112)
CO2: 14 mmol/L — ABNORMAL LOW (ref 19–32)
CO2: 22 mmol/L (ref 19–32)
CREATININE: 0.93 mg/dL (ref 0.50–1.35)
Calcium: 8.5 mg/dL (ref 8.4–10.5)
Chloride: 95 mmol/L — ABNORMAL LOW (ref 96–112)
Creatinine, Ser: 1.26 mg/dL (ref 0.50–1.35)
GFR calc Af Amer: 89 mL/min — ABNORMAL LOW (ref >90)
GFR, EST NON AFRICAN AMERICAN: 77 mL/min — AB (ref >90)
GLUCOSE: 243 mg/dL — AB (ref 70–99)
Glucose, Bld: 141 mg/dL — ABNORMAL HIGH (ref 70–99)
POTASSIUM: 3.3 mmol/L — AB (ref 3.5–5.1)
Potassium: 3.2 mmol/L — ABNORMAL LOW (ref 3.5–5.1)
Sodium: 130 mmol/L — ABNORMAL LOW (ref 135–145)
Sodium: 130 mmol/L — ABNORMAL LOW (ref 135–145)

## 2014-04-26 LAB — I-STAT CG4 LACTIC ACID, ED
LACTIC ACID, VENOUS: 0.9 mmol/L (ref 0.5–2.0)
Lactic Acid, Venous: 2.77 mmol/L (ref 0.5–2.0)

## 2014-04-26 LAB — RAPID URINE DRUG SCREEN, HOSP PERFORMED
Amphetamines: NOT DETECTED
BARBITURATES: NOT DETECTED
BENZODIAZEPINES: NOT DETECTED
Cocaine: NOT DETECTED
Opiates: NOT DETECTED
Tetrahydrocannabinol: POSITIVE — AB

## 2014-04-26 LAB — CBG MONITORING, ED: Glucose-Capillary: 236 mg/dL — ABNORMAL HIGH (ref 70–99)

## 2014-04-26 LAB — TYPE AND SCREEN
ABO/RH(D): A POS
ANTIBODY SCREEN: NEGATIVE

## 2014-04-26 LAB — PROTIME-INR
INR: 1.1 (ref 0.00–1.49)
PROTHROMBIN TIME: 14.4 s (ref 11.6–15.2)

## 2014-04-26 LAB — ABO/RH: ABO/RH(D): A POS

## 2014-04-26 LAB — ETHANOL: Alcohol, Ethyl (B): <5 mg/dL (ref 0–9)

## 2014-04-26 LAB — SALICYLATE LEVEL

## 2014-04-26 LAB — ACETAMINOPHEN LEVEL

## 2014-04-26 MED ORDER — ACETAMINOPHEN 500 MG PO TABS
1000.0000 mg | ORAL_TABLET | Freq: Once | ORAL | Status: AC
  Administered 2014-04-26: 1000 mg via ORAL
  Filled 2014-04-26: qty 2

## 2014-04-26 MED ORDER — ONDANSETRON HCL 4 MG/2ML IJ SOLN
INTRAMUSCULAR | Status: AC
  Filled 2014-04-26: qty 2

## 2014-04-26 MED ORDER — ONDANSETRON HCL 4 MG/2ML IJ SOLN
4.0000 mg | Freq: Once | INTRAMUSCULAR | Status: AC
  Administered 2014-04-26: 4 mg via INTRAVENOUS

## 2014-04-26 MED ORDER — LIDOCAINE HCL (PF) 1 % IJ SOLN
5.0000 mL | Freq: Once | INTRAMUSCULAR | Status: AC
  Administered 2014-04-26: 5 mL
  Filled 2014-04-26: qty 5

## 2014-04-26 MED ORDER — SODIUM CHLORIDE 0.9 % IV BOLUS (SEPSIS)
2000.0000 mL | Freq: Once | INTRAVENOUS | Status: AC
  Administered 2014-04-26: 2000 mL via INTRAVENOUS

## 2014-04-26 MED ORDER — IBUPROFEN 400 MG PO TABS
600.0000 mg | ORAL_TABLET | Freq: Once | ORAL | Status: AC
  Administered 2014-04-26: 600 mg via ORAL
  Filled 2014-04-26 (×2): qty 1

## 2014-04-26 MED ORDER — OXYCODONE-ACETAMINOPHEN 5-325 MG PO TABS
1.0000 | ORAL_TABLET | Freq: Once | ORAL | Status: AC
  Administered 2014-04-26: 1 via ORAL
  Filled 2014-04-26: qty 1

## 2014-04-26 MED ORDER — SODIUM CHLORIDE 0.9 % IV BOLUS (SEPSIS)
1000.0000 mL | Freq: Once | INTRAVENOUS | Status: AC
  Administered 2014-04-26: 1000 mL via INTRAVENOUS

## 2014-04-26 MED ORDER — FENTANYL CITRATE 0.05 MG/ML IJ SOLN
50.0000 ug | Freq: Once | INTRAMUSCULAR | Status: AC
  Administered 2014-04-26: 50 ug via INTRAVENOUS
  Filled 2014-04-26: qty 2

## 2014-04-26 NOTE — Discharge Instructions (Signed)
°Emergency Department Resource Guide °1) Find a Doctor and Pay Out of Pocket °Although you won't have to find out who is covered by your insurance plan, it is a good idea to ask around and get recommendations. You will then need to call the office and see if the doctor you have chosen will accept you as a new patient and what types of options they offer for patients who are self-pay. Some doctors offer discounts or will set up payment plans for their patients who do not have insurance, but you will need to ask so you aren't surprised when you get to your appointment. ° °2) Contact Your Local Health Department °Not all health departments have doctors that can see patients for sick visits, but many do, so it is worth a call to see if yours does. If you don't know where your local health department is, you can check in your phone book. The CDC also has a tool to help you locate your state's health department, and many state websites also have listings of all of their local health departments. ° °3) Find a Walk-in Clinic °If your illness is not likely to be very severe or complicated, you may want to try a walk in clinic. These are popping up all over the country in pharmacies, drugstores, and shopping centers. They're usually staffed by nurse practitioners or physician assistants that have been trained to treat common illnesses and complaints. They're usually fairly quick and inexpensive. However, if you have serious medical issues or chronic medical problems, these are probably not your best option. ° °No Primary Care Doctor: °- Call [facility] at  832-8000 - they can help you locate a primary care doctor that  accepts your insurance, provides certain services, etc. °- Physician Referral Service- 1-800-533-3463 ° °Chronic Pain Problems: °Organization         Address  Phone   Notes  °[facility]  (336) 297-2271 Patients need to be referred by their primary care doctor.  ° °Medication  Assistance: °Organization         Address  Phone   Notes  °[place] [facility] [address] °[place], [place] [address] (336) 641-8030 --Must be a resident of [place] °-- Must have NO insurance coverage whatsoever (no Medicaid/ Medicare, etc.) °-- The pt. MUST have a primary care doctor that directs their care regularly and follows them in the community °  °[facility]  (866) 331-1348   °[employer]  (888) 892-1162   ° °Agencies that provide inexpensive medical care: °Organization         Address  Phone   Notes  °[facility]  (336) 832-8035   °[facility]    (336) 832-7272   °[facility] [address] °[place], [place] [address] (336) 832-4777   °[facility] [address], °[place] (336) 271-4999   °[facility]    (336) 373-0678   °[facility]    (336) 272-1050   °[facility] ° [address], [place] Phone:  (336) 832-4444, Fax:  (336) 832-4440 Hours of Operation:  9 am - 6 pm, M-F.  Also accepts Medicaid/Medicare and self-pay.  °[facility] ° [address], [place] Phone: (336) 832-3150, Fax: (336) 832-3151. Hours of Operation:  8:30 am - 5:30 pm, M-F.  Also accepts Medicaid and self-pay.  °[facility] [place] [address], [place] Phone: (336) 878-6027   °[facility] [address], [place], [place] (336)723-1848, Ext. 123 Mondays & Thursdays: 7-9 AM.  First 15 patients are seen on a first come, first serve basis. °  ° °Medicaid-accepting [place] Providers: ° °Organization         Address  Phone   Notes  °[facility] [address], [place] (336) 641-2100 Also accepts self-pay patients.  °[facility] [address], [place] ° (336) 856-9996   °[facility] [address], [place]  (336) 288-8857   °[facility] [address] [place] [address], [place] (336) 299-7000   °Veita Bland [address], [place]  ° (336) 373-1557 Only accepts [place] Access Medicaid patients after they have their name applied to their card.  ° °Self-Pay (no insurance) in [place]: ° °Organization         Address  Phone   Notes  °Sickle Cell Patients, [facility] [address], [place] (336) 832-1970   °[facility] [address], [place] (336) 832-4400   ° [facility] [facility] ° [address], [place] (336) 992-4800   °[facility]/Dr. Osei-Bonsu ° [address] [place] [address], [place] or [address], [place] (336) 841-8500 Phone number for both [place] and [place] locations is the same.  °[facility] [address], [place] (336) 299-0000   °[facility] [address] [place] [address], [place] or [address] (336) 852-7530 °(336) 878-2260   °[facility] [address], [place] (336) 350-1642, phone; (336) 294-5005, fax Sees patients 1st and 3rd Saturday of every month.  Must not qualify for public or private insurance (i.e. Medicaid, Medicare, [place] Health Choice, Veterans' Benefits) • Household income should be no more than 200% of the poverty level •The clinic cannot treat you if you are pregnant or think you are pregnant • Sexually transmitted diseases are not treated at the clinic.  ° ° °Dental Care: °Organization         Address  Phone  Notes  °[place] [facility] [address], [place] (336) 641-6152 Accepts children up to age 21 who are enrolled in Medicaid or [place] Health Choice; pregnant women with a Medicaid card; and children who have applied for Medicaid or [place] Health Choice, but were declined, whose parents can pay a reduced fee at time of service.  °[place]  [facility] [place]  [address], [place] (336) 641-7733 Accepts children up to age 21 who are enrolled in Medicaid or [place] Health Choice; pregnant women with a Medicaid card; and children who have applied for Medicaid or [place] Health Choice, but were declined, whose parents can pay a reduced fee at time of service.  °[facility] ° [address], [place] (336) 641-4533 Patients are seen by appointment only. Walk-ins are not accepted. [facility] will see patients 18 years of age and older. °Monday - Tuesday (8am-5pm) °Most Wednesdays (8:30-5pm) °$30 per visit, cash only  °[facility] ° [address], [place] (336) 641-4533 Patients are seen by appointment only. Walk-ins are not accepted. [facility] will see patients 18 years of age and older. °One   Wednesday Evening (Monthly: Volunteer Based).  $30 per visit, cash only  °[facility]  (919) 537-3737 for adults; Children under age 4, call Graduate Pediatric Dentistry at (919) [phone]. Children aged 4-14, please call (919) [phone] to request a pediatric application. ° Dental services are provided in all areas of dental care including fillings, crowns and bridges, complete and partial dentures, implants, gum treatment, root canals, and extractions. Preventive care is also provided. Treatment is provided to both adults and children. °Patients are selected via a lottery and there is often a waiting list. °  °[facility] [address], °[place] ° (336) 763-8833 [email or web address] °  °[facility] [address], [place], [place] (336)723-1848, Ext. 123 Second and Fourth Thursday of each month, opens at 6:30 AM; Clinic ends at 9 AM.  Patients are seen on a first-come first-served basis, and a limited number are seen during each clinic.  ° °[facility] ° [address], [place], [place] (336) 723-7904    Eligibility Requirements °You must have lived in [place], or [place] counties for at least the last three months. °  You cannot be eligible for state or federal sponsored healthcare insurance, including Veterans Administration, Medicaid, or Medicare. °  You generally cannot be eligible for healthcare insurance through your employer.  °  How to apply: °Eligibility screenings are held every Tuesday and Wednesday afternoon from 1:00 pm until 4:00 pm. You do not need an appointment for the interview!  °[facility] [address], [place], [place] 336-631-2330   °[facility]  336-342-8273   °[facility]  336-703-3100   °[facility]  336-570-6415   ° °Behavioral Health Resources in the Community: °Intensive Outpatient Programs °Organization         Address  Phone  Notes  °[facility] [address], [place], [place] 336-878-6098   °[facility] [address], [place], [place] 336-832-9800   °[facility] [address], [place], [place] ° 336-882-2125   °[facility] [address],  °[place], [place] 1-800-853-5163 or 336-641-4981   °Substance Abuse Resources °Organization         Address  Phone  Notes  °[facility]  336-882-2125   °[facility]  336-784-9470   °[facility]  336-285-9073   °[facility]  336-845-3988   °Residential & Outpatient Substance Abuse Program  1-800-659-3381   °Psychological Services °Organization         Address  Phone  Notes  °[facility]  336- 832-9600   °[facility]  336- 378-7881   °[facility] [address], [place] 1-800-853-5163 or 336-641-4981   ° °Mobile Crisis Teams °Organization         Address  Phone  Notes  °[facility], Mobile Crisis Care Unit  1-877-626-1772   °Assertive °[facility] ° [address]  [place], [place] 336-834-9664   °Sharon DeEsch [address] °[place] 336-554-5454   ° °Self-Help/Support Groups °Organization         Address  Phone             Notes  °[facility]  - variety of support groups  336- 373-1402 Call for more information  °Narcotics Anonymous (NA), [facility] [address], °[place]  2 meetings at this location  ° °  Residential Treatment Programs °Organization         Address  Phone  Notes  °[facility] [address],    °[place]  1-866-801-8205   °[facility] ° [address], [place], [place] 704-293-8524   °[facility] [address], [place] 336-845-3988 Admissions: 8am-3pm M-F  °[facility] [address]    °[place], [place] 336-841-1104   °[facility] [address], [place], [place] 336-379-7146   °[facility] [address]  °[place], [place] 336-285-9073   °[facility] Intensive Outpatient [address], [place], [place] 336-852-3033   °[facility] [address]  °[place], [place] 1-877-615-2722 or 336-784-9470   °[facility] [address] [place], [place] 336-227-7417 Accepts Medicaid  °[facility] [address]  °[place] 1-800-659-3381 Substance Abuse/Addiction Treatment  ° °[place] Behavioral Health Resources °Organization         Address  Phone  Notes  °[facility]  (888) 581-9988   °Julie Brannon, PhD [address] [place], [place]   (336) 349-5553 or (336) 951-0000   °[facility]   [address] °[place], [place] (336) 349-4454   °[facility] [address], [place], [place] (336) 342-8316 Insurance/Medicaid/sponsorship through [facility]  °[facility] [address]                                    [place], [place] (336) 342-8316 Therapy/tele-psych/case    °[facility] [address]  ° [place], [place] (336) 349-2233    °Dr. Arfeen  (336) 349-4544   °[facility] [place]  [employer] [place] [facility] 1) [address], [place] °2) [address], [place] °3)  [address], [place] (336) 349-3220 °(336) 342-7768 ° °(336) 342-8140   °[place] [facility] (336) 342-1394 or (336) 342-3537 (After Hours)    ° ° °

## 2014-04-26 NOTE — ED Notes (Signed)
[facility] 236.Marland Kitchen.RN notified

## 2014-04-26 NOTE — ED Notes (Signed)
Pt reports wanting assistance with "my drinking problem."  Notified Dr Durward FortesNickle who spoke with pt at bedside.

## 2014-04-26 NOTE — Progress Notes (Signed)
RT responded to level 2 trauma. Pt on room air with spo2 96%. Denies SOB. RT will continue to monitor.

## 2014-04-26 NOTE — Progress Notes (Signed)
Chaplain Note:  Chaplain called pt mother at [phone].   Mother reported that this is the 2nd time he's been in a hospital in 2 weeks for something related to his mental status.  She also asked if he is having altered mental status right now. I informed her that a doctor will be able to speak with her and update her on her son .   She further expressed not to tell the pt this but the police have contacted her concerning some protective measures regarding his mental health.    Will have doctor call mother at number listed above.   Vickii PennaBrown, [place] J, Chaplain 04/26/2014 3:43 PM

## 2014-04-26 NOTE — ED Provider Notes (Signed)
CSN: [id number]     Arrival date & time 04/26/14  1526 History   First MD Initiated Contact with Patient 04/26/14 1529     Chief Complaint  Patient presents with  . Fall  . Head Injury     (Consider location/radiation/quality/duration/timing/severity/associated sxs/prior Treatment) HPI Comments: 29 year old male presents with fall. He was found down in his bathroom by his roommate. He was seen here one week ago for similar where he was found to have overdosed, likely on benzodiazepines. Also has a history of substance abuse including marijuana and opiates. He states he was hypoglycemic and has intermittent episodes of hypoglycemia causing him to pass out. He has a small laceration to his right ear but does not complain of pain. Cervical collar applied in the field.  Patient is a 28 y.o. male presenting with fall. The history is provided by the patient.  Fall This is a new problem. The current episode started today. The problem occurs constantly. The problem has been gradually improving. Associated symptoms include headaches. Pertinent negatives include no coughing or weakness. Nothing aggravates the symptoms. He has tried nothing for the symptoms. The treatment provided no relief.    Past Medical History  Diagnosis Date  . Asthma   . ETOH abuse   . Polysubstance abuse   . Hypoglycemia    History reviewed. No pertinent past surgical history. History reviewed. No pertinent family history. History  Substance Use Topics  . Smoking status: Current Every Day Smoker  . Smokeless tobacco: Not on file  . Alcohol Use: Yes    Review of Systems  Respiratory: Negative for cough.   Neurological: Positive for headaches. Negative for weakness.  All other systems reviewed and are negative.     Allergies  Vancomycin  Home Medications   Prior to Admission medications   Medication Sig Start Date End Date Taking? Authorizing Provider  HYDROcodone-acetaminophen (NORCO) 5-325 MG per tablet  Take 1-2 tablets by mouth every 4 (four) hours as needed for moderate pain. 08/07/13  Yes Loren Racer, MD  ibuprofen (ADVIL,MOTRIN) 600 MG tablet Take 1 tablet (600 mg total) by mouth every 6 (six) hours as needed. Patient not taking: Reported on 04/26/2014 08/07/13   Loren Racer, MD  methocarbamol (ROBAXIN) 500 MG tablet Take 1 tablet (500 mg total) by mouth every 8 (eight) hours as needed for muscle spasms. Patient not taking: Reported on 04/26/2014 08/07/13   Loren Racer, MD   BP 130/76 mmHg  Pulse 77  Temp(Src) 98.1 F (36.7 C) (Oral)  Resp 14  Ht 6' (1.829 m)  Wt 150 lb (68.04 kg)  BMI 20.34 kg/m2  SpO2 97% Physical Exam  Constitutional: He is oriented to person, place, and time. He appears well-developed and well-nourished. No distress.  HENT:  Head: Normocephalic.  Mouth/Throat: Oropharynx is clear and moist. No oropharyngeal exudate.  Small laceration through the external rim of the right ear. Also small laceration to the right temple region with underlying hematoma. Hemostatic.  Eyes: Conjunctivae and EOM are normal. Pupils are equal, round, and reactive to light.  3 mm and reactive bilaterally  Neck: Normal range of motion. Neck supple.  Cervical collar in place. No cervical spine tenderness  Cardiovascular: Normal rate, regular rhythm, normal heart sounds and intact distal pulses.  Exam reveals no gallop and no friction rub.   No murmur heard. Pulmonary/Chest: Effort normal and breath sounds normal. No respiratory distress. He has no wheezes. He has no rales.  Abdominal: Soft. He exhibits no distension and  no mass. There is no tenderness. There is no rebound and no guarding.  Musculoskeletal: Normal range of motion. He exhibits no edema or tenderness.  Lymphadenopathy:    He has no cervical adenopathy.  Neurological: He is alert and oriented to person, place, and time. No cranial nerve deficit.  Skin: Skin is warm and dry. No rash noted. He is not diaphoretic.   Psychiatric: He has a normal mood and affect. His behavior is normal. Judgment and thought content normal.  Nursing note and vitals reviewed.   ED Course  LACERATION REPAIR Date/Time: 04/26/2014 11:01 PM Performed by: Dorna Leitz Authorized by: Dorna Leitz Consent: Verbal consent obtained. Risks and benefits: risks, benefits and alternatives were discussed Consent given by: patient Required items: required blood products, implants, devices, and special equipment available Patient identity confirmed: verbally with patient Body area: head/neck (right ear, behind right ear, right temple) Wound length (cm): right temple 1 cm, behind right ear 2 cm, ear lobe 1 cm. Tendon involvement: none Nerve involvement: none Vascular damage: no Anesthesia: local infiltration Local anesthetic: lidocaine 1% without epinephrine Anesthetic total: 5 ml Preparation: Patient was prepped and draped in the usual sterile fashion. Irrigation solution: saline Irrigation method: jet lavage Amount of cleaning: standard Debridement: none Degree of undermining: none Wound skin closure material used: 4-0 fast gut. Number of sutures: 10 Technique: simple Approximation: close Approximation difficulty: simple Dressing: antibiotic ointment Patient tolerance: Patient tolerated the procedure well with no immediate complications   (including critical care time) Labs Review Labs Reviewed  CBC WITH DIFFERENTIAL/PLATELET - Abnormal; Notable for the following:    WBC 22.3 (*)    Hemoglobin 17.1 (*)    MCH 34.2 (*)    MCHC 36.5 (*)    Neutrophils Relative % 89 (*)    Neutro Abs 19.7 (*)    Lymphocytes Relative 5 (*)    Monocytes Absolute 1.4 (*)    All other components within normal limits  BASIC METABOLIC PANEL - Abnormal; Notable for the following:    Sodium 130 (*)    Potassium 3.2 (*)    Chloride 95 (*)    CO2 14 (*)    Glucose, Bld 243 (*)    GFR calc non Af Amer 77 (*)    GFR calc Af Amer 89 (*)     Anion gap 21 (*)    All other components within normal limits  URINE RAPID DRUG SCREEN (HOSP PERFORMED) - Abnormal; Notable for the following:    Tetrahydrocannabinol POSITIVE (*)    All other components within normal limits  ACETAMINOPHEN LEVEL - Abnormal; Notable for the following:    Acetaminophen (Tylenol), Serum <10.0 (*)    All other components within normal limits  BASIC METABOLIC PANEL - Abnormal; Notable for the following:    Sodium 130 (*)    Potassium 3.3 (*)    Glucose, Bld 141 (*)    All other components within normal limits  CBG MONITORING, ED - Abnormal; Notable for the following:    Glucose-Capillary 236 (*)    All other components within normal limits  I-STAT CG4 LACTIC ACID, ED - Abnormal; Notable for the following:    Lactic Acid, Venous 2.77 (*)    All other components within normal limits  PROTIME-INR  ETHANOL  SALICYLATE LEVEL  I-STAT CG4 LACTIC ACID, ED  TYPE AND SCREEN  ABO/RH    Imaging Review Dg Chest 2 View  04/26/2014   CLINICAL DATA:  Recent fall in shower with right head injury  and right-sided rib pain  EXAM: CHEST  2 VIEW  COMPARISON:  08/07/2013  FINDINGS: The heart size and mediastinal contours are within normal limits. Both lungs are clear. The visualized skeletal structures are unremarkable.  IMPRESSION: No active cardiopulmonary disease.   Electronically Signed   By: Alcide Clever M.D.   On: 04/26/2014 19:45   Ct Head Wo Contrast  04/26/2014   CLINICAL DATA:  Syncopal episode. Struck the right side of his head on the toilet with large laceration. Memory loss.  EXAM: CT HEAD WITHOUT CONTRAST  CT CERVICAL SPINE WITHOUT CONTRAST  TECHNIQUE: Multidetector CT imaging of the head and cervical spine was performed following the standard protocol without intravenous contrast. Multiplanar CT image reconstructions of the cervical spine were also generated.  COMPARISON:  08/07/2013  FINDINGS: CT HEAD FINDINGS  There is no intracranial hemorrhage, mass or evidence  of acute infarction. Gray matter and white matter are normal. The ventricles and basal cisterns appear unremarkable.  The bony structures are intact. There is a right temporal scalp hematoma. The visible portions of the paranasal sinuses are clear.  CT CERVICAL SPINE FINDINGS  The vertebral column, pedicles and facet articulations are intact. There is no evidence of acute fracture. No acute soft tissue abnormalities are evident.  No significant arthritic changes are evident.  IMPRESSION: 1. Normal brain 2. Right temporal scalp hematoma 3. Normal cervical spine.  Negative for acute traumatic injury.   Electronically Signed   By: Ellery Plunk M.D.   On: 04/26/2014 17:06   Ct Cervical Spine Wo Contrast  04/26/2014   CLINICAL DATA:  Syncopal episode. Struck the right side of his head on the toilet with large laceration. Memory loss.  EXAM: CT HEAD WITHOUT CONTRAST  CT CERVICAL SPINE WITHOUT CONTRAST  TECHNIQUE: Multidetector CT imaging of the head and cervical spine was performed following the standard protocol without intravenous contrast. Multiplanar CT image reconstructions of the cervical spine were also generated.  COMPARISON:  08/07/2013  FINDINGS: CT HEAD FINDINGS  There is no intracranial hemorrhage, mass or evidence of acute infarction. Gray matter and white matter are normal. The ventricles and basal cisterns appear unremarkable.  The bony structures are intact. There is a right temporal scalp hematoma. The visible portions of the paranasal sinuses are clear.  CT CERVICAL SPINE FINDINGS  The vertebral column, pedicles and facet articulations are intact. There is no evidence of acute fracture. No acute soft tissue abnormalities are evident.  No significant arthritic changes are evident.  IMPRESSION: 1. Normal brain 2. Right temporal scalp hematoma 3. Normal cervical spine.  Negative for acute traumatic injury.   Electronically Signed   By: Ellery Plunk M.D.   On: 04/26/2014 17:06     EKG  Interpretation   Date/Time:  Friday April 26 2014 15:30:34 EDT Ventricular Rate:  99 PR Interval:  137 QRS Duration: 110 QT Interval:  334 QTC Calculation: 429 R Axis:   86 Text Interpretation:  Age not entered, assumed to be  29 years old for  purpose of ECG interpretation Sinus rhythm LVH with IVCD and secondary  repol abnrm Confirmed by ZAVITZ  MD, JOSHUA ([id number]) on 04/26/2014 3:49:55 PM      MDM   Final diagnoses:  Laceration of ear region, right, initial encounter  Laceration of temple, initial encounter  Polysubstance abuse   29 year old male presents with fall. Likely unintentional overdose based on recent history with opiate and benzodiazepine use. He is not clinically intoxicated on my exam but GCS  is 14-15. Oriented to person and place but not time. Otherwise appropriate responses. He appears to have traumatic injury to his right temporal scalp and ear. CT head and neck ordered from evaluation. Labs including urinalysis and urine drug screen also sent. He was recently seen here for similar episode and when metabolized was given appropriate psychiatric resources which she refused. Police are with him today and report similar drug abuse. His mother is also concerned but has not taken out a protective order against him at this time.  Traumatic workup negative. Initial labs showed lactic acidosis with anion gap. That improved with hydration. 3 L of fluid given and blood pressure adequate, vital signs adequate, on labs completely resolved. The patient now is requesting substance abuse treatment. Admits to doing narcotics as well as benzodiazepines prior to arrival. Medically cleared. We'll discharge to outpatient or inpatient rehabilitation of his choice. Resources provided. Lacerations repaired with absorbable sutures and discussed need for follow-up if signs of infection.  Dorna LeitzAlex Bee Hammerschmidt, MD 04/26/14 [id number]  Blane OharaJoshua Zavitz, MD 04/26/14 [phone]

## 2014-04-27 MED ORDER — TETANUS-DIPHTH-ACELL PERTUSSIS 5-2.5-18.5 LF-MCG/0.5 IM SUSP
0.5000 mL | Freq: Once | INTRAMUSCULAR | Status: AC
  Administered 2014-04-27: 0.5 mL via INTRAMUSCULAR
  Filled 2014-04-27: qty 0.5

## 2014-04-27 MED ORDER — LIDOCAINE-EPINEPHRINE (PF) 2 %-1:200000 IJ SOLN
20.0000 mL | Freq: Once | INTRAMUSCULAR | Status: AC
  Administered 2014-04-27: 10 mL
  Filled 2014-04-27: qty 20

## 2014-04-27 MED ORDER — IBUPROFEN 400 MG PO TABS
600.0000 mg | ORAL_TABLET | Freq: Once | ORAL | Status: AC
  Administered 2014-04-27: 600 mg via ORAL
  Filled 2014-04-27 (×2): qty 1

## 2014-04-27 NOTE — ED Notes (Signed)
Dr Loretha StaplerWofford in w/pt and mother.

## 2014-04-27 NOTE — ED Notes (Signed)
PT'S MOTHER CALLED AND ADVISED SHE IS STILL 1-1/2 HOURS AWAY. [place]

## 2014-04-27 NOTE — ED Notes (Signed)
[place], in [place]/pt - performing suturing to right upper arm.

## 2014-04-27 NOTE — ED Provider Notes (Signed)
11:10 AM I was asked by Dr Loretha StaplerWofford to close an upper extremity wound sustained during fall yesterday prior to discharge from the ED. Open wound to right upper arm.  Wound thoroughly irrigated by staff and by me.      LACERATION REPAIR Performed by: Trixie DredgeWEST, Ibeth Fahmy Authorized by: Trixie DredgeWEST, Dallis Darden Consent: Verbal consent obtained. Risks and benefits: risks, benefits and alternatives were discussed Consent given by: patient Patient identity confirmed: provided demographic data Prepped and Draped in normal sterile fashion Wound explored  Laceration Location: right upper arm  Laceration Length: 5cm, irregular, wishbone-shaped with thick flap  No Foreign Bodies seen or palpated  Anesthesia: local infiltration  Local anesthetic: lidocaine 2% with epinephrine  Anesthetic total: 6 ml  Irrigation method: syringe Amount of cleaning: standard  Skin closure: 4-0 ethilon  Number of sutures: 5  Technique: simple interrupted, loosely approximated  Patient tolerance: Patient tolerated the procedure well with no immediate complications.   Trixie Dredgemily Shonique Pelphrey, PA-C 04/27/14 1236  Blake DivineJohn Wofford, MD 04/28/14 [phone]

## 2014-04-27 NOTE — ED Notes (Addendum)
Pt is discharged and awaiting his mother to come pick him up from the ED. Pt moved to Pod C and provided with blankets in private room.

## 2014-04-27 NOTE — ED Notes (Signed)
WOUND CARE W/SUTURES INFO GIVEN TO PT AND MOTHER.

## 2014-04-27 NOTE — Progress Notes (Signed)
CSW met with this 29 y/o, single, Caucasian male with his mother bedside.  Patient states that he has had inpatient substance abuse treatment before and knows that he needs it again.  Patient presents in hospital garb, affect normal, mood, "could be better," speech and thought process normal, denies S/I, H/I, or psychotic symptoms.  Patient oriented x3, is cooperative and coherent.   Plan is for patient to go home with his mother who lives in [place] and will see about residential treatment.  CSW gave patient substance abuse resources, inpatient, residential, outpatient, and self-help programs.  Patient was thankful and states he will call Monday to get on the waiting list for [facility], [facility], and [facility] since he has no insurance these are three residential placements he can access by being a [facility] resident.  CSW explained to family that they only admit Monday-Friday and that on the weekend detox only admissions.  At this time patient does not meet detox criteria.   Patient does have lacerations that will be examined by the EDP prior to discharge.  [facility] Ariana Juul Richardo Priest ED CSW [phone]

## 2014-04-27 NOTE — ED Notes (Signed)
Spoke with pts mother, she will be here in approx 3 hours. Pt given ice water and ordered breakfast tray.

## 2015-01-18 NOTE — ED Triage Notes (Signed)
Patient arrives to ED after a syncopal episode in his kitchen @ 30 mins ago after " drinking all day". Patient states he did not hit his head, no neck pain noted, no bruising noted no c/o of pain at this time.

## 2015-01-18 NOTE — ED Notes (Signed)
Patient left without being seen after triage.  Patient states, "It's not necessary for me to be here. I would like to go home."     Patient signed informed refusal and safely departed ED.

## 2015-01-18 NOTE — ED Triage Notes (Signed)
Formatting of this note might be different from the original.  Patient arrives to ED after a syncopal episode in his kitchen @ 30 mins ago after " drinking all day". Patient states he did not hit his head, no neck pain noted, no bruising noted no c/o of pain at this time.   Electronically signed by Ladona Mow, RN at 01/18/2015  7:51 PM EST

## 2015-01-18 NOTE — ED Notes (Signed)
Formatting of this note might be different from the original.  Patient left without being seen after triage.  Patient states, "It's not necessary for me to be here. I would like to go home."     Patient signed informed refusal and safely departed ED.    Electronically signed by Wyline Beady, RN at 01/18/2015  8:04 PM EST

## 2015-01-19 ENCOUNTER — Inpatient Hospital Stay: Admit: 2015-01-19 | Discharge: 2015-01-19 | Payer: BLUE CROSS/BLUE SHIELD | Attending: Emergency Medicine

## 2022-05-23 ENCOUNTER — Ambulatory Visit
Admit: 2022-05-23 | Discharge: 2022-05-23 | Payer: BLUE CROSS/BLUE SHIELD | Attending: Family | Primary: Internal Medicine

## 2022-05-23 DIAGNOSIS — R21 Rash and other nonspecific skin eruption: Secondary | ICD-10-CM

## 2022-05-23 NOTE — Patient Instructions (Signed)
Follow up with primary care in 2 weeks.  Go to the Emergency Department with development of any acute symptoms.

## 2022-05-23 NOTE — Progress Notes (Signed)
Subjective:      The patient/guardian gave verbal consent to treat.        Chief Complaint   Patient presents with    Rash     C/o of stomach ache and head rash on forehead. Sx 7 days.        Adam Bell is a 37 y.o. male presenting to clinic today for a work note to go back after having been out for 4 days with vomiting and diarrhea. States he feels better except that he still has watery stools about 4 times daily. Denies abdominal pain, fevers, chills, or appetite change. States he feels well.    Also endorses a rash on the forehead that has been there for "years." States he usually wears a hat and uses mild soaps to not aggravate it. Would like a derm referral.    Does not have an established PCP.     No other complaints today.        History provided by:  Patient  History limited by: nothing.  Language interpreter used: No          Review of Systems   Constitutional:  Negative for chills and fever.   HENT:  Negative for congestion, rhinorrhea and sore throat.    Respiratory:  Negative for cough, chest tightness and shortness of breath.    Cardiovascular:  Negative for chest pain.   Gastrointestinal:  Negative for abdominal pain, nausea and vomiting.   Genitourinary:  Negative for dysuria.   Skin:  Positive for rash.       Review of Systems - negative except as listed above    Reviewed PmHx, RxHx, FmHx, SocHx, AllgHx and updated in chart.  History reviewed. No pertinent family history.  History reviewed. No pertinent past medical history.   Social History     Socioeconomic History    Marital status: Single     Spouse name: None    Number of children: None    Years of education: None    Highest education level: None          Current Outpatient Medications   Medication Sig    cloNIDine (CATAPRES) 0.1 MG tablet Take 1 tablet by mouth 2 times daily as needed    sertraline (ZOLOFT) 100 MG tablet TAKE 1 TABLET BY MOUTH DAILY IN THE MORNING    traZODone (DESYREL) 100 MG tablet Take 1 tablet by mouth  nightly    hydrOXYzine pamoate (VISTARIL) 50 MG capsule TAKE 1 CAPSULE BY MOUTH TWICE DAILY AS NEEDED     No current facility-administered medications for this visit.       Objective:     Vitals:    05/23/22 1253   BP: 121/80   Site: Left Upper Arm   Position: Sitting   Cuff Size: Medium Adult   Pulse: 64   Temp: 98.3 F (36.8 C)   TempSrc: Oral   SpO2: 97%   Weight: 83.5 kg (184 lb)       Physical Exam  Vitals and nursing note reviewed.   Constitutional:       General: He is not in acute distress.     Appearance: Normal appearance. He is not ill-appearing or toxic-appearing.   HENT:      Head: Normocephalic and atraumatic.   Eyes:      Extraocular Movements: Extraocular movements intact.      Conjunctiva/sclera: Conjunctivae normal.   Cardiovascular:      Rate and Rhythm: Normal rate.  Heart sounds: Normal heart sounds.   Pulmonary:      Effort: Pulmonary effort is normal. No respiratory distress.      Breath sounds: Normal breath sounds. No wheezing.   Abdominal:      General: There is no distension.      Palpations: Abdomen is soft.      Tenderness: There is no abdominal tenderness.   Musculoskeletal:      Cervical back: Normal range of motion.   Skin:     General: Skin is warm and dry.      Comments: Erythematous patch spreading across patient's entire upper forehead and into the hair.   Neurological:      General: No focal deficit present.      Mental Status: He is alert.   Psychiatric:         Mood and Affect: Mood normal.         Behavior: Behavior normal.            Assessment/ Plan:     Test Results:  No results found for this or any previous visit (from the past 6 hour(s)).       Diagnosis Orders   1. Rash  AFL -  Robyne Askew, MD, Dermatology, [facility])          Impression:  Provided work note.  Refer to dermatology for skin rash on forehead/ head.  Provided list of PCPs for patient to establish care. Ideally, follow up as soon as possible.  Go to ED with development of any acute  symptoms.     Follow up:  Follow up with PCP as soon as possible.        Georgie Chard, APRN - NP

## 2023-08-06 DIAGNOSIS — Z5329 Procedure and treatment not carried out because of patient's decision for other reasons: Principal | ICD-10-CM

## 2023-08-06 NOTE — ED Triage Notes (Signed)
 Called pt for triage at this time. No answer in waiting room.

## 2023-08-07 ENCOUNTER — Inpatient Hospital Stay: Admit: 2023-08-07 | Discharge: 2023-08-07 | Payer: MEDICAID

## 2023-08-07 NOTE — ED Provider Notes (Signed)
 Final diagnoses:   [F10.90] Alcohol use disorder       HPI    Chief Complaint   Patient presents with   . Alcohol Problem       Adam Bell is a 39 y/o M with a pmh of alcohol use disorder, anxiety, and depression who presented to the ED with his mom per directions of the alcohol detox program he will be going to tomorrow to make sure he is medically cleared after his recent alcohol relapse. Pt reports that he relapsed about a month and a half ago and has been drinking about a pack of beer along with a bottle of liquor every day. His last drink was about 24 hours ago and he endorses feeling very anxious, shaky, moderately nauseous, and with mild sweating. He has also been inhaling air duster for the past couple of days to get high. He denies any other drug use or taking any OTC medications. He is also noncompliant with all of his medications for the last couple of months for his depression and anxiety. He also reports having had 2 episodes of diarrhea in the last day.                 ED Course & MDM    ED Course as of 08/07/23 1452   Sun Aug 07, 2023   1009 Serum drug screen resulted negative   [AK]   1211 Repeat CIWA score 10   [AK]      ED Course User Index  [AK] Hildegard Bathe, MD         Diagnosis as of 08/07/23 1452   Alcohol use disorder       Medical Decision Making  Adam Bell is a 38 y/o M with a pmh of alcohol use disorder, anxiety, and depression who presented to the ED after having a recent alcohol relapse and need for medical clearance before transferring to his detox facility tomorrow. Pt vitals are normal in ED with mild htn of 151/101. Pt current sx register a CIWA score of 10 at 24 hrs since his last drink. Pt is not visibly intoxicated and is A&Ox4. He denies and SI/HI. Risk for alcohol withdrawal is low at this point, however given timeline since last drink is not completely clear and pt's anxiety for relapse we will admit pt to CDU for observation with planned discharge tomorrow if there is no  worsening withdrawal symptoms with medical clearance. Pt will receive 1L NS bolus, serum drug screen, CMP, CBC and Zofran and Hydroxyzine for supportive management and anxiety control.     Amount and/or Complexity of Data Reviewed  Labs: ordered.    Risk  Prescription drug management.  Decision regarding hospitalization.                             Glasgow Coma Scale Score: 15 (08/07/2023 12:55 PM)                    SAFE-T Assessment             Patient History        Diagnosis Date   . Anxiety    . Depression    . Sleep difficulties        No past surgical history on file.    No family history on file.    Social History     Socioeconomic History   . Marital status: Single  Tobacco Use   . Smoking status: Unknown   . Tobacco comments:     Pt vape consistently throughout day   Vaping Use   . Vaping status: Every Day   . Substances: Nicotine   Substance and Sexual Activity   . Alcohol use: Yes     Alcohol/week: 2.0 standard drinks of alcohol     Types: 2 Shots of liquor per week   . Drug use: Not Currently   . Sexual activity: Yes     Partners: Female        Review of Systems    Review of Systems   Constitutional:  Positive for chills and diaphoresis.   HENT: Negative.     Eyes: Negative.    Respiratory: Negative.     Cardiovascular: Negative.    Gastrointestinal:  Positive for diarrhea and nausea.   Genitourinary: Negative.    Musculoskeletal: Negative.    Skin: Negative.    Neurological:  Positive for dizziness, tremors, light-headedness and headaches. Negative for seizures and syncope.   Psychiatric/Behavioral:  Negative for agitation, confusion and suicidal ideas. The patient is nervous/anxious.         Physical Exam    ED Triage Vitals [08/07/23 0721]   Temp Heart Rate Resp BP   36.7 C (98.1 F) 74 20 (!) 151/101      SpO2 Temp src Heart Rate Source Patient Position   96 % Oral -- --      BP Location FiO2 (%)     -- --         Physical Exam  Constitutional:       Appearance: Normal appearance.   HENT:       Head: Normocephalic and atraumatic.   Cardiovascular:      Rate and Rhythm: Normal rate and regular rhythm.   Pulmonary:      Effort: Pulmonary effort is normal.      Breath sounds: Normal breath sounds.   Abdominal:      General: Abdomen is flat.      Palpations: Abdomen is soft.   Skin:     General: Skin is warm.   Neurological:      General: No focal deficit present.      Mental Status: He is alert and oriented to person, place, and time.   Psychiatric:         Behavior: Behavior normal.      Comments: Anxious                    Please see nursing notes for additional information regarding past medical, family, and social history as well as medication and vital signs. Please see nursing documentation for medications given.         Hildegard Bathe, MD  Resident  08/07/23 1209    Teaching Provider Attestation: I personally saw and evaluated the patient. I repeated the critical/key portions of the service and discussed with the resident.  I have reviewed the resident's documentation and edited as appropriate.   Date of service if different than date of resident service:    Harlene Beards, MD         Harlene Beards, MD  08/07/23 1452

## 2023-08-08 NOTE — Discharge Summary (Signed)
 -------------------------------------------------------------------------------  Attestation signed by Lanna Stank, MD at 08/08/2023  3:34 PM  This was a Physician/APP shared visit, of which I provided the substantive portion of the visit. I agree with the documentation with the following additions and/or corrections based on my findings:    Came to the emergency room to get medical clearance before going to a detox facility, remained stable overnight.  Was given Librium in the emergency room and discharged on Librium taper.  Questions and concerns have been addressed.  Patient spoke with addiction medicine here and was also discharged with naltrexone. Follow-up on 08/12/2023.   -------------------------------------------------------------------------------      Clinical Decision Unit Discharge Summary     Admitting Provider: Duwaine Score, MD  Discharge Provider: Lanna Stank, MD  Primary Care Physician at Discharge: TANDA JONELLE AHMADI, MD  Admission Date: 08/07/2023                                           Discharge Date: 08/08/2023 11:43 AM    Reason for Hospitalization     1. Alcohol use disorder      Assessment & Plan  Alcohol intoxication in relapsed alcoholic (CMS/HCC)    Alcohol dependence with uncomplicated withdrawal (CMS/HCC)    You are being discharge to a substance use rehabilitation program. A librium taper has been provided to you for management of withdrawal symptoms, in addition, you are being started on naltrexone with follow up with addiction medicine scheduled for 7.18.25       Primary Visit Diagnosis     Alcohol intoxication in relapsed alcoholic (CMS/HCC) [F10.21]  Alcohol dependence with uncomplicated withdrawal (CMS/HCC) [F10.230]    Outpatient Follow-Up and Patient Discharge Instructions     Follow up with addiction medicine on 7.18.25 at 1420    Adam Bell, you came to the ED to get medically cleared before going to your detox facility tomorrow. We did not find you to be in active  alcohol withdrawal and upon our assessment we did not find anything requiring you to be admitted to the hospital. From our standpoint you are medically cleared and should go to your alcohol detox program after refill of your medications that were provided to you here in the ED. Additionally, you are being started on naltrexone and a librium taper.       Medications Prescribed at Discharge        Your medication list        START taking these medications        Instructions Last Dose Given Next Dose Due   chlordiazePOXIDE 25 MG capsule  Commonly known as: Librium  Start taking on: August 08, 2023      Take 2 capsules by mouth every 6 hours for 1 day, THEN 2 capsules every 8 hours for 1 day, THEN 2 capsules every 12 hours for 1 day, THEN 2 capsules at bedtime for 1 day.       cloNIDine 0.1 MG tablet  Commonly known as: Catapres      Take 1 tablet by mouth 2 times daily.       naltrexone 50 MG tablet  Commonly known as: Depade      Take 1 tablet by mouth daily.              CHANGE how you take these medications        Instructions Last Dose  Given Next Dose Due   sertraline 50 MG tablet  Commonly known as: Zoloft  What changed: See the new instructions.      Take 1 tablet by mouth daily.              CONTINUE taking these medications        Instructions Last Dose Given Next Dose Due   hydrOXYzine HCl 50 MG tablet  Commonly known as: Atarax      Take 1 tablet by mouth 2 times daily as needed for anxiety.       propranolol 10 MG tablet  Commonly known as: Inderal      Take 1 tablet by mouth 2 times daily as needed (for anxiety, refractory to hydroxyzine (1st line anxiety AS NEEDED)).              STOP taking these medications      traZODone 50 MG tablet  Commonly known as: Desyrel                  Where to Get Your Medications        These medications were sent to [facility]  [address], [place] [address]      Hours: 8AM-7PM [facility] Sat-Sun Phone: [phone]   chlordiazePOXIDE 25 MG  capsule  cloNIDine 0.1 MG tablet  hydrOXYzine HCl 50 MG tablet  naltrexone 50 MG tablet  propranolol 10 MG tablet  sertraline 50 MG tablet         Hospital Course     Adam Bell was admitted to the CDU for EtOH withdrawal and anxiety and depression. Started on librium with improvement in his symptoms. Addiction medicine consulted for disposition planning and follow up. Refill of his psychiatric medications filled at [facility] discharge pharmacy. Patient being discharged to     CDU Course Updates     08/08/23  9:36 AM  Visit Vitals  BP 121/75 (BP Location: Right arm, Patient Position: Sitting)   Pulse 74   Temp 36.6 C (97.9 F) (Oral)   Resp 16      Patient seen on AM rounds with attending; VS stable ON. Addiction Medicine Consulted appreciate recommendations. Abdomen soft NTTP, no increased WOB, CNS intact no focal deficits.   Joanie Steil, NP      Medications Given Throughout Hospital Stay     Medications   sodium chloride flush 0.9 % 1-10 mL (has no administration in time range)   chlordiazePOXIDE (Librium) capsule 25 mg (25 mg Oral Given 08/07/23 2144)   chlordiazePOXIDE (Librium) capsule 50 mg (50 mg Oral Given 08/07/23 1548)   sodium chloride 0.9% bolus 1,000 mL (0 mL Intravenous Stopped 08/07/23 1918)   hydrOXYzine HCl (Atarax) tablet 50 mg (50 mg Oral Given 08/07/23 0826)   ondansetron ODT (Zofran-ODT) disintegrating tablet 4 mg (4 mg Oral Given 08/07/23 0826)   chlordiazePOXIDE (Librium) capsule 25 mg (25 mg Oral Given 08/07/23 1032)       Lab Results     No results found for this or any previous visit (from the past 12 hours).    Diagnostic Results     No orders to display       Discharge Physical Exam     Physical Exam   General: no acute distress  Heart: RRR  Lungs: CTAB  Abd: Soft, non-tender    Condition of Patient at Discharge     Improved    Discharge Disposition     Rehab  Time Spent Coordinating Discharge     30 minutes    Information given to patient/family/representative     Information regarding appts,  diagnoses, post-hospital care instructions and education materials is provided to the patient/family/rep in writing on the After Visit Summary at the time of Discharge. A copy for the 'Provider' (PCP or Referring) is also given.    To reach providers in the [facility]     Call Telepage at [phone] and page the discharge service or call [phone]. [facility] provider numbers can also be accessed via the web at: [email or web address]

## 2023-08-24 ENCOUNTER — Inpatient Hospital Stay: Admit: 2023-08-24 | Discharge: 2023-08-24 | Arrived: AM | Attending: Emergency Medicine

## 2023-08-24 ENCOUNTER — Emergency Department: Admit: 2023-08-24 | Primary: Internal Medicine

## 2023-08-24 DIAGNOSIS — S20212A Contusion of left front wall of thorax, initial encounter: Principal | ICD-10-CM

## 2023-08-24 MED ORDER — IBUPROFEN 400 MG PO TABS
400 | Freq: Once | ORAL | Status: AC
Start: 2023-08-24 — End: 2023-08-24
  Administered 2023-08-24: 17:00:00 800 mg via ORAL

## 2023-08-24 MED FILL — IBUPROFEN 400 MG PO TABS: 400 mg | ORAL | Qty: 2 | Fill #0

## 2023-08-24 NOTE — ED Provider Notes (Signed)
 EMERGENCY DEPARTMENT HISTORY AND PHYSICAL EXAM    Date: 08/24/2023  Patient Name: Adam Bell  Patient Age and Sex: 38 y.o. male  MRN:  [id number]  CSN:  [id number]    History of Present Illness     Chief Complaint   Patient presents with    MEDICAL CLEARANCE       History Provided By: Patient    Ability to gather history was limited by:     HPI: Adam Bell, 38 y.o. male   With history of alcoholism, alcohol withdrawal seizures, anxiety, brought to the emergency department in police custody for medical clearance, as the patient is currently intoxicated.  He also complains of mild pain in his left ribs which he attributes to a fall a couple days ago.  Patient also offhandedly mentioned SI to the nurse, did not endorse or make any comments about suicidal thoughts to me.      Tobacco Use      Smoking status: Unknown      Smokeless tobacco: Not on file     Past History   The patient's medical, surgical, and social history were reviewed by me today.    Current Medications:  No current facility-administered medications on file prior to encounter.     Current Outpatient Medications on File Prior to Encounter   Medication Sig Dispense Refill    propranolol (INDERAL) 10 MG immediate release tablet Take 1 tablet by mouth 2 times daily      cloNIDine (CATAPRES) 0.1 MG tablet Take 1 tablet by mouth 2 times daily as needed      sertraline (ZOLOFT) 100 MG tablet TAKE 1 TABLET BY MOUTH DAILY IN THE MORNING      traZODone (DESYREL) 100 MG tablet Take 1 tablet by mouth nightly      hydrOXYzine pamoate (VISTARIL) 50 MG capsule TAKE 1 CAPSULE BY MOUTH TWICE DAILY AS NEEDED         History reviewed. No pertinent past medical history.    History reviewed. No pertinent surgical history.    Social History     Tobacco Use    Smoking status: Unknown       Allergies:  Allergies   Allergen Reactions    Vancomycin Rash     Review of Systems   A Review of Systems was reviewed by me today during this  encounter.  Pertinent positive and negative elements are noted in the HPI and MDM sections.    Review of Systems   Constitutional:  Negative for fever.   Neurological:  Negative for seizures and headaches.   Psychiatric/Behavioral:  Positive for behavioral problems.    All other systems reviewed and are negative.      Physical Exam   Vital Signs  Patient Vitals for the past 8 hrs:   Temp Pulse Resp BP SpO2   08/24/23 1221 97.9 F (36.6 C) 90 16 (!) 150/105 92 %          Physical Exam  Vitals reviewed.   Constitutional:       General: He is not in acute distress.     Appearance: Normal appearance. He is not ill-appearing.   Cardiovascular:      Rate and Rhythm: Normal rate and regular rhythm.   Pulmonary:      Effort: Pulmonary effort is normal. No respiratory distress.      Breath sounds: No wheezing or rhonchi.   Chest:      Chest wall: Tenderness present. No crepitus.  Comments: Mild tenderness left ribs, no crepitus  Abdominal:      General: Abdomen is flat. There is no distension.      Palpations: Abdomen is soft.      Tenderness: There is no abdominal tenderness.   Skin:     General: Skin is warm and dry.   Neurological:      General: No focal deficit present.      Mental Status: He is alert and oriented to person, place, and time.      Comments: Clinically intoxicated with alcohol         Diagnostic Study Results   Labs  No results found for this visit on 08/24/23.   ==============================================================    Radiologic Studies  XR RIBS LEFT INCLUDE CHEST (MIN 3 VIEWS)   Final Result   No acute or focal rib abnormality.            Electronically signed by BERLE COMFORT             Critical Care and Billable Procedures   EKG reviewed by ED Physician Thornell in the absence of a cardiologist: Yes  EKG below was independently interpreted by me Alverda Thornell, MD)    Procedures    Medical Decision Making   I reviewed the patient's most recent Emergency Dept notes and  diagnostic tests in formulating my MDM on today's visit.    Medications administered during ED course:  Medications   ibuprofen  (ADVIL ;MOTRIN ) tablet 800 mg (800 mg Oral Given 08/24/23 1241)     Medical Decision Making // ED Course // Reassessment:  Adam Bell, 38 y.o. male With history of alcoholism, alcohol withdrawal seizures, anxiety, brought to the emergency department in police custody for medical clearance, as the patient is currently intoxicated.  He also complains of mild pain in his left ribs which he attributes to a fall a couple days ago.  Patient also offhandedly mentioned SI to the nurse, did not endorse or make any comments about suicidal thoughts to me.    On examination he has reassuring vital signs.  He is clinically intoxicated with alcohol.  He does not show any signs of alcohol withdrawal at this time.  No concerning tremors or fasciculations.  He has mild tenderness in the left ribs, without crepitus or deformity or bruising.    X-rays were negative.  No CT imaging is indicated based on my H&P.    He does not appear to be actively suicidal.  Patient is calm and cooperative, relatively pleasant.  He did not endorse SI to me, and no further psychiatric evaluation is indicated.    He is stable for discharge in police custody and safe for confinement/jail.        Radiology results independently interpreted by me: Normal chest x-ray, no pneumothorax or fractures    I will be the attending of record for this patient.          Vicenta Thornell, MD      Additional documentation if relevant for this encounter     ED Course Updates      Diagnosis and Disposition     Disposition: Decision To Discharge 08/24/2023 01:11:05 PM     Final Diagnosis:   1. Rib contusion, left, initial encounter    2. Acute alcoholic intoxication without complication    3. Alcoholism (HCC)    4. Suicidal ideation    5. Substance induced mood disorder (HCC)  Medication List        ASK your doctor about these  medications      cloNIDine 0.1 MG tablet  Commonly known as: CATAPRES     hydrOXYzine pamoate 50 MG capsule  Commonly known as: VISTARIL     propranolol 10 MG immediate release tablet  Commonly known as: INDERAL     sertraline 100 MG tablet  Commonly known as: ZOLOFT     traZODone 100 MG tablet  Commonly known as: DESYREL               Follow up:  No follow-up provider specified.     Disclaimers   I was the first provider for this patient on this visit.  To the best of my ability I reviewed relevant prior medical records, electrocardiograms, laboratories, and radiologic studies.    The patient's presenting problems were discussed, and the patient was in agreement with the care plan formulated and outlined with them.     Please note that this dictation was completed with Dragon voice recognition software. Quite often unanticipated grammatical, syntax, homophones, and other interpretive errors are inadvertently transcribed by the computer software.  Please disregard these errors and excuse any errors that have escaped final proofreading.    I personally performed the services described in this documentation on this date 08/24/23  for patient Quadarius Henton.      Vicenta Jaeger, MD  1:12 PM           Jaeger Vicenta, MD  08/24/23 [phone]

## 2023-08-24 NOTE — Discharge Instructions (Signed)
 Adam Bell has been evaluated and is medically cleared for incarceration in police custody.    X-rays did not identify any rib fractures.

## 2023-09-02 ENCOUNTER — Emergency Department: Admit: 2023-09-03 | Primary: Internal Medicine

## 2023-09-02 ENCOUNTER — Inpatient Hospital Stay
Admission: EM | Admit: 2023-09-03 | Discharge: 2023-09-09 | Disposition: A | Discharge status: Other Acute Inpatient Hospital | Admitting: Hospitalist

## 2023-09-02 ENCOUNTER — Inpatient Hospital Stay: Admit: 2023-09-03 | Primary: Internal Medicine

## 2023-09-02 DIAGNOSIS — F10939 Alcohol use, unspecified with withdrawal, unspecified (HCC): Secondary | ICD-10-CM

## 2023-09-02 DIAGNOSIS — F10229 Alcohol dependence with intoxication, unspecified: Secondary | ICD-10-CM

## 2023-09-02 LAB — CBC WITH AUTO DIFFERENTIAL
Basophils %: 1.4 % — ABNORMAL HIGH (ref 0.0–1.0)
Basophils Absolute: 0.11 K/UL — ABNORMAL HIGH (ref 0.00–0.10)
Eosinophils %: 3.7 % (ref 0.0–7.0)
Eosinophils Absolute: 0.28 K/UL (ref 0.00–0.40)
Hematocrit: 48.6 % (ref 36.6–50.3)
Hemoglobin: 15.7 g/dL (ref 12.1–17.0)
Immature Granulocytes %: 0.7 % — ABNORMAL HIGH (ref 0.0–0.5)
Immature Granulocytes Absolute: 0.05 K/UL — ABNORMAL HIGH (ref 0.00–0.04)
Lymphocytes %: 41.9 % (ref 12.0–49.0)
Lymphocytes Absolute: 3.21 K/UL (ref 0.80–3.50)
MCH: 31.6 pg (ref 26.0–34.0)
MCHC: 32.3 g/dL (ref 30.0–36.5)
MCV: 97.8 FL (ref 80.0–99.0)
MPV: 10.1 FL (ref 8.9–12.9)
Monocytes %: 10.6 % (ref 5.0–13.0)
Monocytes Absolute: 0.81 K/UL (ref 0.00–1.00)
Neutrophils %: 41.7 % (ref 32.0–75.0)
Neutrophils Absolute: 3.21 K/UL (ref 1.80–8.00)
Nucleated RBCs: 0 /100{WBCs}
Platelets: 315 K/uL (ref 150–400)
RBC: 4.97 M/uL (ref 4.10–5.70)
RDW: 14.2 % (ref 11.5–14.5)
WBC: 7.7 K/uL (ref 4.1–11.1)
nRBC: 0 K/uL (ref 0.00–0.01)

## 2023-09-02 LAB — URINE CULTURE HOLD SAMPLE

## 2023-09-02 LAB — EXTRA TUBES HOLD

## 2023-09-02 NOTE — ED Triage Notes (Addendum)
 Pt arrived ambulatory to the ER with CC ETOH. Drinking 2 giant bottles of wine a day. Last drink was 2 hours ago. +tremorous, HA, nausea, cold sweats. +pins and needles in hands and feet.     Attempted ativan , librium and valium  at home for ETOH withdrawals    PMH: ETOH withdrawal with seizures

## 2023-09-02 NOTE — ED Provider Notes (Signed)
 [facility]  EMERGENCY DEPARTMENT ENCOUNTER      Pt Name: Adam Bell  MRN: [id number]  Birthdate 1985-10-25  Date of evaluation: 09/02/2023  Provider: Dorothyann Shove, MD    CHIEF COMPLAINT       Chief Complaint   Patient presents with    Alcohol Problem         HISTORY OF PRESENT ILLNESS    Adam Bell is a 38 yo M with h/o alcohol use disorder and withdrawal seizures.  HE notes that he drinks Vodka daily and finished last bottle in the middle of the afternoon yesterday.  He notes he is trying to quit and had been give librium but it had not helped.  HE started to get tremors and sweating.  He drank some wine this afternoon and still felt worse so came to the ED.            Additional history from independent historians:     Review of External Medical Records:     Nursing Notes were reviewed.    REVIEW OF SYSTEMS       Review of Systems    Except as noted above the remainder of the review of systems was reviewed and negative.       PAST MEDICAL HISTORY   No past medical history on file.      SURGICAL HISTORY     No past surgical history on file.      CURRENT MEDICATIONS       Previous Medications    CLONIDINE (CATAPRES) 0.1 MG TABLET    Take 1 tablet by mouth 2 times daily as needed    HYDROXYZINE PAMOATE (VISTARIL) 50 MG CAPSULE    TAKE 1 CAPSULE BY MOUTH TWICE DAILY AS NEEDED    PROPRANOLOL (INDERAL) 10 MG IMMEDIATE RELEASE TABLET    Take 1 tablet by mouth 2 times daily    SERTRALINE  (ZOLOFT ) 100 MG TABLET    TAKE 1 TABLET BY MOUTH DAILY IN THE MORNING    TRAZODONE  (DESYREL ) 100 MG TABLET    Take 1 tablet by mouth nightly       ALLERGIES     Vancomycin    FAMILY HISTORY     No family history on file.       SOCIAL HISTORY       Social History     Socioeconomic History    Marital status: Single   Tobacco Use    Smoking status: Unknown         PHYSICAL EXAM       ED Triage Vitals [09/02/23 1849]   BP Systolic BP Percentile Diastolic BP Percentile Temp Temp Source Pulse  Respirations SpO2   110/78 -- -- 98 F (36.7 C) Temporal 75 16 95 %      Height Weight - Scale         1.803 m (5' 11) 87.2 kg (192 lb 3.9 oz)             Body mass index is 26.81 kg/m.    Physical Exam  Vitals and nursing note reviewed.   Constitutional:       General: He is not in acute distress.  HENT:      Head: Normocephalic and atraumatic.      Mouth/Throat:      Mouth: Mucous membranes are moist.   Eyes:      Extraocular Movements: Extraocular movements intact.      Conjunctiva/sclera: Conjunctivae normal.  Cardiovascular:      Rate and Rhythm: Normal rate.   Pulmonary:      Effort: Pulmonary effort is normal. No respiratory distress.   Abdominal:      General: There is no distension.   Musculoskeletal:         General: No deformity.      Cervical back: Normal range of motion.   Skin:     General: Skin is warm and dry.   Neurological:      General: No focal deficit present.      Mental Status: He is alert.      Motor: Tremor present.         DIAGNOSTIC RESULTS     EKG: All EKG's are interpreted by the Emergency Department Physician listed in the interpretation in the absence of a cardiologist and may also be found below under Reassessment/ED Course.           RADIOLOGY:   Non-plain film images such as CT, Ultrasound and MRI are read by the radiologist. Plain radiographic images are visualized and preliminarily interpreted by the emergency physician with the below findings:    Interpretation per the Radiologist below, if available at the time of this note:    CT HEAD WO CONTRAST    (Results Pending)   CT CHEST WO CONTRAST    (Results Pending)        LABS:  Labs Reviewed   CBC WITH AUTO DIFFERENTIAL - Abnormal; Notable for the following components:       Result Value    Basophils % 1.4 (*)     Immature Granulocytes % 0.7 (*)     Basophils Absolute 0.11 (*)     Immature Granulocytes Absolute 0.05 (*)     All other components within normal limits   COMPREHENSIVE METABOLIC PANEL - Abnormal; Notable for the  following components:    BUN 5 (*)     BUN/Creatinine Ratio 6 (*)     ALT 371 (*)     AST 296 (*)     Alk Phosphatase 138 (*)     Globulin 4.4 (*)     Albumin/Globulin Ratio 0.8 (*)     All other components within normal limits   LIPASE - Abnormal; Notable for the following components:    Lipase 229 (*)     All other components within normal limits   URINE CULTURE HOLD SAMPLE   EXTRA TUBES HOLD   URINALYSIS WITH MICROSCOPIC   ETHANOL       All other labs were within normal range or not returned as of this dictation.    EMERGENCY DEPARTMENT COURSE and DIFFERENTIAL DIAGNOSIS/MDM:   Vitals:    Vitals:    09/02/23 1849 09/02/23 2007   BP: 110/78 135/81   Pulse: 75 76   Resp: 16 20   Temp: 98 F (36.7 C)    TempSrc: Temporal    SpO2: 95% 97%   Weight: 87.2 kg (192 lb 3.9 oz)    Height: 1.803 m (5' 11)            Medical Decision Making  Amount and/or Complexity of Data Reviewed  Radiology: ordered.    Risk  OTC drugs.  Prescription drug management.  Decision regarding hospitalization.            REASSESSMENT          CRITICAL CARE TIME   Total Critical Care time was 35 minutes, excluding separately reportable procedures.  CONSULTS:  IP CONSULT TO SOCIAL WORK  IP CONSULT TO HOSPITALIST    PROCEDURES:  Unless otherwise noted below, none     Procedures        FINAL IMPRESSION      1. Alcohol withdrawal syndrome with complication (HCC)    2. Contusion of left chest wall, initial encounter          DISPOSITION/PLAN   DISPOSITION      Perfect Serve Consult for Admission  9:40 PM    ED Room Number: ER20/20  Patient Name and age:  Adam Bell 38 y.o.  male  Working Diagnosis:   1. Alcohol withdrawal syndrome with complication (HCC)    2. Contusion of left chest wall, initial encounter        COVID-19 Suspicion: No  Sepsis present:  No  Reassessment needed: No  Code Status:  Full Code  Readmission: No  Isolation Requirements: no  Recommended Level of Care: step down  Department: [facility] Adult ED - [phone]  Consulting Provider: Pegram    Other:  h/o alcohol use disorder and withdrawal seizures.  Normally drinks 750ml vodka daily.  Stopped yesterday.  Today started to have nausea and tremors, drank a bottle of wine which did not help.  ETOH 121, lipase 229.  Had fallen in the shower somewhat recently and complaining or left chest wall tenderness.  CT head and non contrast chest obtained and I have reviewed and no ICH, fracture or effusion noted.  [id number], phenobarbital  ordered.     PATIENT REFERRED TO:  No follow-up provider specified.    DISCHARGE MEDICATIONS:  New Prescriptions    No medications on file         (Please note that portions of this note were completed with a voice recognition program.  Efforts were made to edit the dictations but occasionally words are mis-transcribed.)    Dorothyann Shove, MD (electronically signed)  Emergency Attending Physician            Shove Dorothyann BROCKS, MD  09/02/23 2157

## 2023-09-02 NOTE — H&P (Signed)
 History and Physical    Date of Service:  09/02/2023  Primary Care Provider: Pearley Rush, MD  Source of information: The patient and Chart review    Chief Complaint: Alcohol Problem      History of Presenting Illness:   Adam Bell is a 38 y.o. male with past medical history of anxiety, depression, alcohol dependence presented to the ED with chief complaint of alcohol intoxication.  Patient reportedly drinks 2 large bottles of wine per day with last drink about 2 hours prior to admission.  He reportedly was tremulous, having headaches, nausea, pins-and-needles feeling in hands and feet.  He reportedly had used Ativan , Librium, Valium  on with concern for alcohol withdrawal without relief of symptoms.  Symptoms noted remain constant, severe, without specific alleviating factors.  Patient was last hospitalized at [facility] from 08/07/2023 to 08/08/2023 with diagnosis of alcohol intoxication and relapsed alcoholic.  Patient reported was discharged with substance use rehabilitation program with plan for Librium taper in addition to naltrexone with scheduled appointment to an addiction medicine clinic on 08/12/2023.  On arrival to ED, initial reported vital signs were temperature 98.0 F, BP 110/78, heart rate 75, respiratory rate 16, O2 saturation 95% on room air.  Abnormal labs included alkaline phosphatase 138, ALT 371, AST 296, lipase 229, alcohol level 121.  CT head without IV contrast showed no acute intracranial abnormalities.  CT chest without IV contrast showed mild patchy infiltrate in the right upper lobe and minimal infiltrate in the left upper lobe which may be secondary to aspiration or pneumonia with no acute bony abnormalities.  CT abdomen pelvis without IV contrast showed punctate bilateral renal calculi (nonobstructive), hepatic steatosis, colonic diverticulosis, fecal stasis, but no acute intraperitoneal process.  Patient was multivitamin, thiamine , folic acid  and started  on phenobarbital  taper.  Patient is now seen for admission to the hospitalist service..  Patient was prior history of alcohol withdrawal seizures.     REVIEW OF SYSTEMS:  Pertinent items are noted in the History of Present Illness.     Past medical history:  Anxiety  Depression  Alcohol dependence    Medications:  Prior to Admission medications    Medication Sig Start Date End Date Taking? Authorizing Provider   propranolol (INDERAL) 10 MG immediate release tablet Take 1 tablet by mouth 2 times daily    [provider]   cloNIDine (CATAPRES) 0.1 MG tablet Take 1 tablet by mouth 2 times daily as needed 05/08/22   [provider]   sertraline  (ZOLOFT ) 100 MG tablet TAKE 1 TABLET BY MOUTH DAILY IN THE MORNING 05/08/22   [provider]   traZODone  (DESYREL ) 100 MG tablet Take 1 tablet by mouth nightly 05/08/22   [provider]   hydrOXYzine pamoate (VISTARIL) 50 MG capsule TAKE 1 CAPSULE BY MOUTH TWICE DAILY AS NEEDED 05/08/22   [provider]     Allergies   Allergen Reactions    Vancomycin Rash      Family history:  Unknown regarding family members for heart disease or strokes    Social history:  Smoking: Reported quit smoking cigarettes.  Still vapes.  Alcohol: Per patient, he drinks about 750 mL vodka per day  Illicit drugs: Denies    Social Drivers of Health     Tobacco Use: Not on file (01/18/2015)   Alcohol Use: Alcohol Misuse (08/24/2023)    AUDIT-C     Frequency of Alcohol Consumption: 4 or more times a week  Average Number of Drinks: 5 or 6     Frequency of Binge Drinking: Daily or almost daily   Financial Resource Strain: Not on file   Food Insecurity: Not on file   Transportation Needs: Not on file   Physical Activity: Not on file   Stress: Not on file   Social Connections: Not on file   Intimate Partner Violence: Not on file   Depression: Not on file   Housing Stability: Not on file   Interpersonal Safety: Not on file   Utilities: Not on file        Medications  were reconciled to the best of my ability given all available resources at the time of admission. Route is PO if not otherwise noted.     Family and social history were personally reviewed, all pertinent and relevant details are outlined as above.    Objective:   BP 113/53 (MAP 70)  Pulse 92  Temp 98 F (36.7 C) (Temporal)   Resp 16  Ht 1.803 m (5' 11)   Wt 87.2 kg (192 lb 3.9 oz)   SpO2 95% on room air  BMI 26.81 kg/m         PHYSICAL EXAM:   General: Patient in no acute respiratory distress.  HEENT: Normocephalic.  Atraumatic.  PERRLA EOMI.  Pupils 2 mm reactive bilateral.  Sclera anicteric.  Conjunctive clear.  No otorrhea.  No rhinorrhea.  Nares patent.  Oropharynx clear.  Tongue midline/nonedematous.   Neck: Supple, no JVD, no meningeal signs.  No carotid bruits.  Trachea midline.  Respiratory/ Chest: Clear to auscultation bilaterally   CVS: RRR, S1 S2 heard, no murmurs/rubs/gallops  GI/ Abd: Soft.  Nontender.  Nondistended.  No rebound, guarding, or rigidity.  Normoactive bowel sounds.  No auscultated abdominal bruits or palpable abdominal mass.  Musculoskeletal/Ext: No clubbing, no cyanosis, no edema.  No acute bony/soft tissue deformity.   Neuro: GCS 15.  Moves all extremities x 4.  Strength 5/5 BUE/BLE.  No slurred speech.  No facial droop.  Sensation grossly intact.  No pronator drift   Psych: Initially asleep but aroused to loud verbal stimuli.  Oriented x 4.  Cooperative.  Cap refill: Brisk, less than 3 seconds  Vascular/ Pulses: 2+ radial, 1+ dorsalis pedis pulse bilateral.  Integument/ Skin: Warm, dry, without rashes or lesions      Data Review:   I have independently reviewed and interpreted patient's lab and all other diagnostic data    Abnormal Labs Reviewed   CBC WITH AUTO DIFFERENTIAL - Abnormal; Notable for the following components:       Result Value    Basophils % 1.4 (*)     Immature Granulocytes % 0.7 (*)     Basophils Absolute 0.11 (*)     Immature Granulocytes Absolute 0.05 (*)      All other components within normal limits   COMPREHENSIVE METABOLIC PANEL - Abnormal; Notable for the following components:    BUN 5 (*)     BUN/Creatinine Ratio 6 (*)     ALT 371 (*)     AST 296 (*)     Alk Phosphatase 138 (*)     Globulin 4.4 (*)     Albumin/Globulin Ratio 0.8 (*)     All other components within normal limits   ETHANOL - Abnormal; Notable for the following components:    Ethanol Lvl 121 (*)     All other components within normal limits   LIPASE - Abnormal; Notable for  the following components:    Lipase 229 (*)     All other components within normal limits       @MICRORESULTS @    IMAGING:   CT HEAD WO CONTRAST   Final Result   No acute intracranial abnormalities.         Electronically signed by NORVAL Husky      CT CHEST WO CONTRAST   Final Result   1.  There are no acute bony abnormalities.   2.  There is mild patchy infiltrate in the right upper lobe and minimal   infiltrate in the left upper lobe. The findings may be secondary to aspiration   or pneumonia.      Electronically signed by NORVAL Husky           ECG/ECHO:  @LASTCARDIOLOGY @       Notes reviewed from all clinical/nonclinical/nursing services involved in patient's clinical care. Care coordination discussions were held with appropriate clinical/nonclinical/ nursing providers based on care coordination needs.     Assessment:   Given the patient's current clinical presentation, there is a high level of concern for decompensation if discharged from the emergency department. Complex decision making was performed, which includes reviewing the patient's available past medical records, laboratory results, and imaging studies.    Principal Problem:    Alcohol withdrawal syndrome with complication (HCC)  Resolved Problems:    * No resolved hospital problems. *      Plan:      Alcohol withdrawal syndrome with complication (HCC)  - Ethanol level = 121 mg/dL  - Already started on CIWA alcohol withdrawal protocol with phenobarbital  taper per ED  -  Encourage alcohol cessation  - Multivitamin, thiamine , folic acid  daily therapy    2.  Elevated LFTs       Hepatic steatosis  - Alkaline phosphatase 138, ALT 371, AST 296, lipase 229, alcohol level 121.   - Repeat LFTs in a.m.    3.  Elevated lipase  - Acute pancreatitis, possible but non-tender abdominal exam and no related GI symptoms  - Lipase = 229  - NPO  - IVFs while NPO  - Repeat lipase level    4.  Pneumonia of both lungs due to infectious organism  - CT chest without IV contrast showed mild patchy infiltrate in the right upper lobe and minimal infiltrate in the left upper lobe which may be secondary to aspiration or pneumonia   - Order paired blood cultures  - Order urine legionella antigen  - Order IV antibiotics:  Ceftriaxone  2000 mg hours  Azithromycin  500 mg IV every 24 hours    5.  Anxiety       Depression  - Resume home medications:  Propranolol 10 mg p.o. twice daily  Zoloft  100 mg p.o. daily  Trazodone  100 mg p.o. nightly    6.  Vaping  - Encouraged to stop vaping    7.  Renal calculi  - Punctate nonobstructive bilateral renal calculi per CT report    8.  Fecal stasis  - Ordered bowel regimen/stool softener    DIET: NPO  ISOLATION PRECAUTIONS: No active isolations  CODE STATUS: FULL CODE  Central Line:   none  DVT PROPHYLAXIS: SCDs  FUNCTIONAL STATUS PRIOR TO HOSPITALIZATION: Fully active and ambulatory; able to carry on all self-care without restriction.  Ambulatory status/function: By self   EARLY MOBILITY ASSESSMENT: Recommend routine ambulation while hospitalized with the assistance of nursing staff  ANTICIPATED DISCHARGE: Greater than 48 hours.  ANTICIPATED DISPOSITION: Home  EMERGENCY CONTACT/SURROGATE DECISION MAKER:     No emergency contact listed  CRITICAL CARE WAS PERFORMED FOR THIS ENCOUNTER: NO.      Signed By: Seena Piano, MD     September 02, 2023         Please note that this dictation may have been completed with Dragon, the computer voice recognition software.  Quite often  unanticipated grammatical, syntax, homophones, and other interpretive errors are inadvertently transcribed by the computer software.  Please disregard these errors.  Please excuse any errors that have escaped final proofreading.

## 2023-09-03 LAB — URINALYSIS WITH MICROSCOPIC
BACTERIA, URINE: NEGATIVE /HPF
Bilirubin, Urine: NEGATIVE
Blood, Urine: NEGATIVE
Glucose, Ur: NEGATIVE mg/dL
Ketones, Urine: NEGATIVE mg/dL
Leukocyte Esterase, Urine: NEGATIVE
Nitrite, Urine: NEGATIVE
Protein, UA: NEGATIVE mg/dL
Specific Gravity, UA: 1.014 (ref 1.003–1.030)
Urobilinogen, Urine: 0.2 EU/dL (ref 0.2–1.0)
pH, Urine: 5 (ref 5.0–8.0)

## 2023-09-03 LAB — COMPREHENSIVE METABOLIC PANEL W/ REFLEX TO MG FOR LOW K
ALT: 336 U/L — ABNORMAL HIGH (ref 10–50)
AST: 258 U/L — ABNORMAL HIGH (ref 10–50)
Albumin/Globulin Ratio: 0.8 — ABNORMAL LOW (ref 1.1–2.2)
Albumin: 2.8 g/dL — ABNORMAL LOW (ref 3.5–5.2)
Alk Phosphatase: 114 U/L (ref 40–129)
Anion Gap: 12 mmol/L (ref 2–14)
BUN/Creatinine Ratio: 8 — ABNORMAL LOW (ref 12–20)
BUN: 8 mg/dL (ref 6–20)
CO2: 21 mmol/L (ref 20–29)
Calcium: 8.5 mg/dL — ABNORMAL LOW (ref 8.6–10.0)
Chloride: 107 mmol/L (ref 98–107)
Creatinine: 0.97 mg/dL (ref 0.70–1.20)
Est, Glom Filt Rate: >90 ml/min/1.73m2 (ref >90)
Globulin: 3.5 g/dL (ref 2.0–4.0)
Glucose: 90 mg/dL (ref 65–100)
Potassium: 4.1 mmol/L (ref 3.5–5.1)
Sodium: 140 mmol/L (ref 136–145)
Total Bilirubin: 0.4 mg/dL (ref 0.0–1.2)
Total Protein: 6.4 g/dL (ref 6.4–8.3)

## 2023-09-03 LAB — URINALYSIS WITH REFLEX TO CULTURE
BACTERIA, URINE: NEGATIVE /HPF
Bilirubin, Urine: NEGATIVE
Blood, Urine: NEGATIVE
Glucose, Ur: NEGATIVE mg/dL
Leukocyte Esterase, Urine: NEGATIVE
Nitrite, Urine: NEGATIVE
Protein, UA: NEGATIVE mg/dL
Specific Gravity, UA: 1.019 (ref 1.003–1.030)
Urobilinogen, Urine: 0.2 EU/dL (ref 0.2–1.0)
pH, Urine: 5.5 (ref 5.0–8.0)

## 2023-09-03 LAB — CBC WITH AUTO DIFFERENTIAL
Basophils %: 1.8 % — ABNORMAL HIGH (ref 0.0–1.0)
Basophils Absolute: 0.12 K/UL — ABNORMAL HIGH (ref 0.00–0.10)
Eosinophils %: 4.3 % (ref 0.0–7.0)
Eosinophils Absolute: 0.29 K/UL (ref 0.00–0.40)
Hematocrit: 42.9 % (ref 36.6–50.3)
Hemoglobin: 14 g/dL (ref 12.1–17.0)
Immature Granulocytes %: 0.6 % — ABNORMAL HIGH (ref 0.0–0.5)
Immature Granulocytes Absolute: 0.04 K/UL (ref 0.00–0.04)
Lymphocytes %: 42 % (ref 12.0–49.0)
Lymphocytes Absolute: 2.85 K/UL (ref 0.80–3.50)
MCH: 31.4 pg (ref 26.0–34.0)
MCHC: 32.6 g/dL (ref 30.0–36.5)
MCV: 96.2 FL (ref 80.0–99.0)
MPV: 10 FL (ref 8.9–12.9)
Monocytes %: 12.5 % (ref 5.0–13.0)
Monocytes Absolute: 0.85 K/UL (ref 0.00–1.00)
Neutrophils %: 38.8 % (ref 32.0–75.0)
Neutrophils Absolute: 2.64 K/UL (ref 1.80–8.00)
Nucleated RBCs: 0 /100{WBCs}
Platelets: 264 K/uL (ref 150–400)
RBC: 4.46 M/uL (ref 4.10–5.70)
RDW: 14.1 % (ref 11.5–14.5)
WBC: 6.8 K/uL (ref 4.1–11.1)
nRBC: 0 K/uL (ref 0.00–0.01)

## 2023-09-03 LAB — COMPREHENSIVE METABOLIC PANEL
ALT: 371 U/L — ABNORMAL HIGH (ref 10–50)
AST: 296 U/L — ABNORMAL HIGH (ref 10–50)
Albumin/Globulin Ratio: 0.8 — ABNORMAL LOW (ref 1.1–2.2)
Albumin: 3.5 g/dL (ref 3.5–5.2)
Alk Phosphatase: 138 U/L — ABNORMAL HIGH (ref 40–129)
Anion Gap: 12 mmol/L (ref 2–14)
BUN/Creatinine Ratio: 6 — ABNORMAL LOW (ref 12–20)
BUN: 5 mg/dL — ABNORMAL LOW (ref 6–20)
CO2: 21 mmol/L (ref 20–29)
Calcium: 9.1 mg/dL (ref 8.6–10.0)
Chloride: 107 mmol/L (ref 98–107)
Creatinine: 0.89 mg/dL (ref 0.70–1.20)
Est, Glom Filt Rate: >90 ml/min/1.73m2 (ref >90)
Globulin: 4.4 g/dL — ABNORMAL HIGH (ref 2.0–4.0)
Glucose: 98 mg/dL (ref 65–100)
Potassium: 4.3 mmol/L (ref 3.5–5.1)
Sodium: 140 mmol/L (ref 136–145)
Total Bilirubin: 0.3 mg/dL (ref 0.0–1.2)
Total Protein: 7.9 g/dL (ref 6.4–8.3)

## 2023-09-03 LAB — URINE DRUG SCREEN
Amphetamine, Urine: NEGATIVE
Barbiturates, Urine: POSITIVE — AB
Benzodiazepines, Urine: POSITIVE — AB
Cocaine, Urine: NEGATIVE
Fentanyl: NEGATIVE
Methadone, Urine: NEGATIVE
Opiates, Urine: NEGATIVE
Phencyclidine, Urine: NEGATIVE
THC, TH-Cannabinol, Urine: NEGATIVE

## 2023-09-03 LAB — LIPASE
Lipase: 229 U/L — ABNORMAL HIGH (ref 13–60)
Lipase: 50 U/L (ref 13–60)

## 2023-09-03 LAB — ETHANOL: Ethanol Lvl: 121 mg/dL — ABNORMAL HIGH (ref <11)

## 2023-09-03 LAB — AMMONIA: Ammonia: 30 umol/L (ref 16–60)

## 2023-09-03 MED ORDER — NORMAL SALINE FLUSH 0.9 % IV SOLN
0.9 | Freq: Two times a day (BID) | INTRAVENOUS | Status: AC
Start: 2023-09-03 — End: ?
  Administered 2023-09-03 – 2023-09-09 (×11): 10 mL via INTRAVENOUS

## 2023-09-03 MED ORDER — CEFTRIAXONE SODIUM 2 G IJ SOLR
2 | INTRAMUSCULAR | Status: DC
Start: 2023-09-03 — End: 2023-09-03
  Administered 2023-09-03: 14:00:00 2000 mg via INTRAVENOUS

## 2023-09-03 MED ORDER — SODIUM CHLORIDE 0.9 % IV BOLUS
0.9 | Freq: Once | INTRAVENOUS | Status: AC
Start: 2023-09-03 — End: 2023-09-02
  Administered 2023-09-03: 01:00:00 1000 mL via INTRAVENOUS

## 2023-09-03 MED ORDER — SODIUM CHLORIDE 0.9 % IV SOLN
0.9 | INTRAVENOUS | Status: AC | PRN
Start: 2023-09-03 — End: ?

## 2023-09-03 MED ORDER — NORMAL SALINE FLUSH 0.9 % IV SOLN
0.9 | INTRAVENOUS | Status: AC | PRN
Start: 2023-09-03 — End: ?

## 2023-09-03 MED ORDER — DIAZEPAM 5 MG/ML IJ SOLN
5 | INTRAMUSCULAR | Status: DC | PRN
Start: 2023-09-03 — End: 2023-09-04
  Administered 2023-09-03 – 2023-09-04 (×4): 5 mg via INTRAVENOUS

## 2023-09-03 MED ORDER — PHENOBARBITAL 32.4 MG PO TABS
32.4 | Freq: Four times a day (QID) | ORAL | Status: DC | PRN
Start: 2023-09-03 — End: 2023-09-03
  Administered 2023-09-03 (×2): 64.8 mg via ORAL

## 2023-09-03 MED ORDER — ONDANSETRON HCL 4 MG/2ML IJ SOLN
4 | Freq: Once | INTRAMUSCULAR | Status: AC
Start: 2023-09-03 — End: 2023-09-02
  Administered 2023-09-03: 01:00:00 4 mg via INTRAVENOUS

## 2023-09-03 MED ORDER — THIAMINE HCL 100 MG PO TABS
100 | Freq: Every day | ORAL | Status: AC
Start: 2023-09-03 — End: ?
  Administered 2023-09-03 – 2023-09-09 (×7): 100 mg via ORAL

## 2023-09-03 MED ORDER — ONDANSETRON HCL 4 MG/2ML IJ SOLN
4 | Freq: Four times a day (QID) | INTRAMUSCULAR | Status: AC | PRN
Start: 2023-09-03 — End: ?
  Administered 2023-09-03 – 2023-09-09 (×16): 4 mg via INTRAVENOUS

## 2023-09-03 MED ORDER — PHENOBARBITAL 32.4 MG PO TABS
32.4 | Freq: Four times a day (QID) | ORAL | Status: AC
Start: 2023-09-03 — End: 2023-09-03
  Administered 2023-09-03 (×4): 64.8 mg via ORAL

## 2023-09-03 MED ORDER — FOLIC ACID 1 MG PO TABS
1 | Freq: Every day | ORAL | Status: AC
Start: 2023-09-03 — End: ?
  Administered 2023-09-03 – 2023-09-09 (×7): 1 mg via ORAL

## 2023-09-03 MED ORDER — SERTRALINE HCL 50 MG PO TABS
50 | Freq: Every day | ORAL | Status: DC
Start: 2023-09-03 — End: 2023-09-03

## 2023-09-03 MED ORDER — PHENOBARBITAL 32.4 MG PO TABS
32.4 | Freq: Two times a day (BID) | ORAL | Status: AC
Start: 2023-09-03 — End: 2023-09-06
  Administered 2023-09-06 (×2): 16.2 mg via ORAL

## 2023-09-03 MED ORDER — STERILE WATER FOR INJECTION (MIXTURES ONLY)
1 | INTRAMUSCULAR | Status: DC
Start: 2023-09-03 — End: 2023-09-03

## 2023-09-03 MED ORDER — TRAZODONE HCL 50 MG PO TABS
50 | Freq: Every evening | ORAL | Status: DC
Start: 2023-09-03 — End: 2023-09-03

## 2023-09-03 MED ORDER — PHENOBARBITAL 32.4 MG PO TABS
32.4 | Freq: Four times a day (QID) | ORAL | Status: DC | PRN
Start: 2023-09-03 — End: 2023-09-03

## 2023-09-03 MED ORDER — POLYETHYLENE GLYCOL 3350 17 G PO PACK
17 | Freq: Every day | ORAL | Status: AC | PRN
Start: 2023-09-03 — End: ?

## 2023-09-03 MED ORDER — SERTRALINE HCL 50 MG PO TABS
50 | Freq: Every day | ORAL | Status: AC
Start: 2023-09-03 — End: ?
  Administered 2023-09-03 – 2023-09-09 (×7): 50 mg via ORAL

## 2023-09-03 MED ORDER — ONDANSETRON 4 MG PO TBDP
4 | Freq: Three times a day (TID) | ORAL | Status: AC | PRN
Start: 2023-09-03 — End: ?

## 2023-09-03 MED ORDER — NORMAL SALINE FLUSH 0.9 % IV SOLN
0.9 | Freq: Two times a day (BID) | INTRAVENOUS | Status: AC
Start: 2023-09-03 — End: ?
  Administered 2023-09-03 – 2023-09-09 (×10): 10 mL via INTRAVENOUS

## 2023-09-03 MED ORDER — LACTATED RINGERS IV SOLN
INTRAVENOUS | Status: AC
Start: 2023-09-03 — End: ?
  Administered 2023-09-03 – 2023-09-07 (×9): via INTRAVENOUS

## 2023-09-03 MED ORDER — MULTIPLE VITAMINS PO TABS
Freq: Every day | ORAL | Status: AC
Start: 2023-09-03 — End: ?
  Administered 2023-09-03 – 2023-09-09 (×7): 1 via ORAL

## 2023-09-03 MED ORDER — PHENOBARBITAL 32.4 MG PO TABS
32.4 | Freq: Four times a day (QID) | ORAL | Status: AC
Start: 2023-09-03 — End: 2023-09-04
  Administered 2023-09-04 (×4): 32.4 mg via ORAL

## 2023-09-03 MED ORDER — PHENOBARBITAL 32.4 MG PO TABS
32.4 | Freq: Two times a day (BID) | ORAL | Status: AC
Start: 2023-09-03 — End: 2023-09-05
  Administered 2023-09-05 (×2): 32.4 mg via ORAL

## 2023-09-03 MED ORDER — AZITHROMYCIN 500 MG IV SOLR
500 | INTRAVENOUS | Status: DC
Start: 2023-09-03 — End: 2023-09-03
  Administered 2023-09-03: 14:00:00 500 mg via INTRAVENOUS

## 2023-09-03 MED ORDER — ENOXAPARIN SODIUM 40 MG/0.4ML IJ SOSY
40 | Freq: Every day | INTRAMUSCULAR | Status: AC
Start: 2023-09-03 — End: ?
  Administered 2023-09-03 – 2023-09-09 (×7): 40 mg via SUBCUTANEOUS

## 2023-09-03 MED FILL — LACTATED RINGERS IV SOLN: INTRAVENOUS | Qty: 1000 | Fill #0

## 2023-09-03 MED FILL — SERTRALINE HCL 50 MG PO TABS: 50 mg | ORAL | Qty: 1 | Fill #0

## 2023-09-03 MED FILL — MONOJECT FLUSH SYRINGE 0.9 % IV SOLN: 0.9 % | INTRAVENOUS | Qty: 40 | Fill #0

## 2023-09-03 MED FILL — PHENOBARBITAL 32.4 MG PO TABS: 32.4 mg | ORAL | Qty: 2 | Fill #0

## 2023-09-03 MED FILL — THIAMINE HCL 100 MG PO TABS: 100 mg | ORAL | Qty: 1 | Fill #0

## 2023-09-03 MED FILL — ONDANSETRON HCL 4 MG/2ML IJ SOLN: 4 MG/2ML | INTRAMUSCULAR | Qty: 2 | Fill #0

## 2023-09-03 MED FILL — FOLIC ACID 1 MG PO TABS: 1 mg | ORAL | Qty: 1 | Fill #0

## 2023-09-03 MED FILL — THERA PO TABS: ORAL | Qty: 1 | Fill #0

## 2023-09-03 MED FILL — AZITHROMYCIN 500 MG IV SOLR: 500 mg | INTRAVENOUS | Qty: 500 | Fill #0

## 2023-09-03 MED FILL — SODIUM CHLORIDE 0.9 % IV SOLN: 0.9 % | INTRAVENOUS | Qty: 1000 | Fill #0

## 2023-09-03 MED FILL — DIAZEPAM 5 MG/ML IJ SOLN: 5 mg/mL | INTRAMUSCULAR | Qty: 2 | Fill #0

## 2023-09-03 MED FILL — CEFTRIAXONE SODIUM 2 G IJ SOLR: 2 g | INTRAMUSCULAR | Qty: 2000 | Fill #0

## 2023-09-03 MED FILL — ENOXAPARIN SODIUM 40 MG/0.4ML IJ SOSY: 40 MG/0.4ML | INTRAMUSCULAR | Qty: 0.4 | Fill #0

## 2023-09-03 MED FILL — SERTRALINE HCL 50 MG PO TABS: 50 mg | ORAL | Qty: 2 | Fill #0

## 2023-09-03 NOTE — ED Notes (Signed)
 Verbal shift change report given to RN, Albino (oncoming nurse) by OBIE Drivers (offgoing nurse). Report included the following information Nurse Handoff Report, Index, ED Encounter Summary, ED SBAR, Adult Overview, Intake/Output, MAR, Recent Results, Neuro Assessment, and Event Log.

## 2023-09-03 NOTE — Progress Notes (Signed)
 [facility]                                                                                          Hospitalist Progress Note  Adam FORBES Boeck, MD  Answering service: [phone] OR [phone] from in house phone        Date of Service:  09/03/2023  NAME:  Adam Bell  DOB:  08/03/1985  MRN:  [id number]       Admission Summary:   Adam Bell is a 38 y.o. male with past medical history of anxiety, depression, alcohol dependence presented to the ED with chief complaint of alcohol intoxication.  Patient reportedly drinks 2 large bottles of wine per day with last drink about 2 hours prior to admission.  He reportedly was tremulous, having headaches, nausea, pins-and-needles feeling in hands and feet.  He reportedly had used Ativan , Librium, Valium  on with concern for alcohol withdrawal without relief of symptoms.  Symptoms noted remain constant, severe, without specific alleviating factors.  Patient was last hospitalized at [facility] from 08/07/2023 to 08/08/2023 with diagnosis of alcohol intoxication and relapsed alcoholic.  Patient reported was discharged with substance use rehabilitation program with plan for Librium taper in addition to naltrexone with scheduled appointment to an addiction medicine clinic on 08/12/2023.  On arrival to ED, initial reported vital signs were temperature 98.0 F, BP 110/78, heart rate 75, respiratory rate 16, O2 saturation 95% on room air.  Abnormal labs included alkaline phosphatase 138, ALT 371, AST 296, lipase 229, alcohol level 121.  CT head without IV contrast showed no acute intracranial abnormalities.  CT chest without IV contrast showed mild patchy infiltrate in the right upper lobe and minimal infiltrate in the left upper lobe which may be secondary to aspiration or pneumonia with no acute bony abnormalities.  CT abdomen pelvis without IV contrast showed punctate bilateral renal calculi (nonobstructive), hepatic  steatosis, colonic diverticulosis, fecal stasis, but no acute intraperitoneal process.  Patient was multivitamin, thiamine , folic acid  and started on phenobarbital  taper.  Patient is now seen for admission to the hospitalist service..  Patient was prior history of alcohol withdrawal seizures.        Interval history / Subjective:   Patient seen and examined in the emergency room.  He reports his last drink was yesterday.  He is experiencing significant anxiety and bilateral hand tremors.  He is requesting Ativan .  He is currently on CIWA protocol with phenobarbital .  He states phenobarbital  does not work for me.  He was last discharged from [facility] on Valium , he finished the course, and then started drinking.  He states he is committed to quit and wants to enter an inpatient alcohol rehab center after discharge.     Assessment & Plan:         Alcohol withdrawal syndrome with complication (HCC)  - Ethanol level = 121 mg/dL  - Already started on CIWA alcohol withdrawal protocol with phenobarbital  taper per ED  - Encourage alcohol cessation  - Multivitamin, thiamine , folic acid  daily therapy     2.  Elevated LFTs  Hepatic steatosis  - Alkaline phosphatase 138, ALT 371, AST 296, lipase 229, alcohol level 121.   - Trend     3.  Elevated lipase  - Acute pancreatitis, possible but non-tender abdominal exam and no related GI symptoms  - Lipase = 229  - Patient hungry, start diet, monitor clinically and trend level;     4.  Pneumonia of both lungs due to infectious organism -ruled out  - CT chest without IV contrast showed mild patchy infiltrate in the right upper lobe and minimal infiltrate in the left upper lobe which may be secondary to aspiration or pneumonia   - Patient started on empiric antibiotics, but he is afebrile, has no leukocytosis, not coughing or short of breath; discontinue antibiotics and monitor clinically;     5.  Anxiety       Depression  - Resume home medications;     6.  Vaping  - Encouraged to stop  vaping     7.  Renal calculi  - Punctate nonobstructive bilateral renal calculi per CT report     8.  Fecal stasis  - Ordered bowel regimen/stool softener       Code status: Full  Prophylaxis: Bilateral SCDs  Care Plan discussed with: Patient, mother in the room, RN in the ED;  Anticipated Disposition: TBD  Inpatient  Cardiac monitoring: Telemetry     Principal Problem:    Alcohol withdrawal syndrome with complication (HCC)  Active Problems:    Pneumonia of both lungs due to infectious organism  Resolved Problems:    * No resolved hospital problems. *         Social Drivers of Health     Tobacco Use: Not on file (01/18/2015)   Alcohol Use: Alcohol Misuse (08/24/2023)    AUDIT-C     Frequency of Alcohol Consumption: 4 or more times a week     Average Number of Drinks: 5 or 6     Frequency of Binge Drinking: Daily or almost daily   Financial Resource Strain: Not on file   Food Insecurity: Not on file   Transportation Needs: Not on file   Physical Activity: Not on file   Stress: Not on file   Social Connections: Not on file   Intimate Partner Violence: Not on file   Depression: Not on file   Housing Stability: Not on file   Interpersonal Safety: Not on file   Utilities: Not on file       Review of Systems:   Pertinent items are noted in HPI.       Vital Signs:    Last 24hrs VS reviewed since prior progress note. Most recent are:  Vitals:    09/03/23 1616   BP: (!) 140/60   Pulse: 77   Resp: 14   Temp: 99.1 F (37.3 C)   SpO2: 97%         Intake/Output Summary (Last 24 hours) at 09/03/2023 1853  Last data filed at 09/03/2023 0645  Gross per 24 hour   Intake 1000 ml   Output 550 ml   Net 450 ml        Physical Examination:     I had a face to face encounter with this patient and independently examined them on 09/03/2023 as outlined below:          General : alert x 3, awake, no acute distress,   HEENT: EOMI, moist mucus membrane, TM clear  Neck: supple, no JVD, no meningeal  signs  Chest: Clear to auscultation bilaterally    CVS: S1 S2 heard, Capillary refill less than 2 seconds  Abd: soft/ non tender, non distended, BS physiological,   Ext: no clubbing, no cyanosis, no edema, brisk 2+ DP pulses  Neuro/Psych: pleasant mood and affect, CN 2-12 grossly intact, Strength 5/5 in all extremities,  Resting and intentional tremors in both hands  Skin: warm     Data Review:    Review and/or order of clinical lab test  Review and/or order of tests in the radiology section of CPT  Review and/or order of tests in the medicine section of CPT      I have personally and independently reviewed all pertinent labs, diagnostic studies, imaging, and have provided independent interpretation of the same.     Labs:     Recent Labs     09/02/23  1904 09/03/23  0614   WBC 7.7 6.8   HGB 15.7 14.0   HCT 48.6 42.9   PLT 315 264     Recent Labs     09/02/23  1904 09/03/23  0614   NA 140 140   K 4.3 4.1   CL 107 107   CO2 21 21   BUN 5* 8     Recent Labs     09/02/23  1904 09/03/23  0614   ALT 371* 336*   GLOB 4.4* 3.5     No results for input(s): INR, APTT in the last 72 hours.    Invalid input(s): PTP   No results for input(s): TIBC in the last 72 hours.    Invalid input(s): FE, PSAT, FERR   No results found for: RBCF   No results for input(s): PH, PCO2, PO2 in the last 72 hours.  No results for input(s): CPK in the last 72 hours.    Invalid input(s): CPKMB, CKNDX, TROIQ  No results found for: CHOL, CHLST, CHOLV, HDL, HDLC, LDL, LDLC  No results found for: GLUCPOC  @LABUA @    Notes reviewed from all clinical/nonclinical/nursing services involved in patient's clinical care. Care coordination discussions were held with appropriate clinical/nonclinical/ nursing providers based on care coordination needs.         Patients current active Medications were reviewed, considered, added and adjusted based on the clinical condition today.      Home Medications were reconciled to the best of my ability given all available  resources at the time of admission. Route is PO if not otherwise noted.      Medications Reviewed:     Current Facility-Administered Medications   Medication Dose Route Frequency    lactated ringers  infusion   IntraVENous Continuous    sertraline  (ZOLOFT ) tablet 50 mg  50 mg Oral Daily    diazePAM  (VALIUM ) injection 5 mg  5 mg IntraVENous Q4H PRN    sodium chloride  flush 0.9 % injection 5-40 mL  5-40 mL IntraVENous 2 times per day    sodium chloride  flush 0.9 % injection 5-40 mL  5-40 mL IntraVENous PRN    0.9 % sodium chloride  infusion   IntraVENous PRN    thiamine  tablet 100 mg  100 mg Oral Daily    folic acid  (FOLVITE ) tablet 1 mg  1 mg Oral Daily    multivitamin 1 tablet  1 tablet Oral Daily    PHENobarbital  tablet 32.4 mg  32.4 mg Oral 4x daily    Followed by    NOREEN ON 09/04/2023] PHENobarbital  tablet 32.4 mg  32.4 mg Oral  BID    Followed by    NOREEN ON 09/05/2023] PHENobarbital  tablet 16.2 mg  16.2 mg Oral BID    sodium chloride  flush 0.9 % injection 5-40 mL  5-40 mL IntraVENous 2 times per day    sodium chloride  flush 0.9 % injection 5-40 mL  5-40 mL IntraVENous PRN    0.9 % sodium chloride  infusion   IntraVENous PRN    ondansetron  (ZOFRAN -ODT) disintegrating tablet 4 mg  4 mg Oral Q8H PRN    Or    ondansetron  (ZOFRAN ) injection 4 mg  4 mg IntraVENous Q6H PRN    polyethylene glycol (GLYCOLAX ) packet 17 g  17 g Oral Daily PRN    enoxaparin  (LOVENOX ) injection 40 mg  40 mg SubCUTAneous Daily     Current Outpatient Medications   Medication Sig    propranolol (INDERAL) 10 MG immediate release tablet Take 1 tablet by mouth 2 times daily    cloNIDine (CATAPRES) 0.1 MG tablet Take 1 tablet by mouth as needed in the morning and 1 tablet as needed in the evening.    sertraline  (ZOLOFT ) 100 MG tablet Take 0.5 tablets by mouth daily    hydrOXYzine pamoate (VISTARIL) 50 MG capsule     traZODone  (DESYREL ) 100 MG tablet Take 1 tablet by mouth nightly (Patient not taking: Reported on 09/03/2023)      ______________________________________________________________________  EXPECTED LENGTH OF STAY: Unable to retrieve estimated LOS  ACTUAL LENGTH OF STAY:          1                 Oshea Percival E Jaleisa Brose, MD

## 2023-09-04 LAB — CBC WITH AUTO DIFFERENTIAL
Basophils %: 1.2 % — ABNORMAL HIGH (ref 0.0–1.0)
Basophils Absolute: 0.09 K/UL (ref 0.00–0.10)
Eosinophils %: 4.3 % (ref 0.0–7.0)
Eosinophils Absolute: 0.31 K/UL (ref 0.00–0.40)
Hematocrit: 43.3 % (ref 36.6–50.3)
Hemoglobin: 14.7 g/dL (ref 12.1–17.0)
Immature Granulocytes %: 0.6 % — ABNORMAL HIGH (ref 0.0–0.5)
Immature Granulocytes Absolute: 0.04 K/UL (ref 0.00–0.04)
Lymphocytes %: 45.7 % (ref 12.0–49.0)
Lymphocytes Absolute: 3.32 K/UL (ref 0.80–3.50)
MCH: 31.9 pg (ref 26.0–34.0)
MCHC: 33.9 g/dL (ref 30.0–36.5)
MCV: 93.9 FL (ref 80.0–99.0)
MPV: 10 FL (ref 8.9–12.9)
Monocytes %: 12.9 % (ref 5.0–13.0)
Monocytes Absolute: 0.94 K/UL (ref 0.00–1.00)
Neutrophils %: 35.3 % (ref 32.0–75.0)
Neutrophils Absolute: 2.57 K/UL (ref 1.80–8.00)
Nucleated RBCs: 0 /100{WBCs}
Platelets: 278 K/uL (ref 150–400)
RBC: 4.61 M/uL (ref 4.10–5.70)
RDW: 14 % (ref 11.5–14.5)
WBC: 7.3 K/uL (ref 4.1–11.1)
nRBC: 0 K/uL (ref 0.00–0.01)

## 2023-09-04 LAB — COMPREHENSIVE METABOLIC PANEL W/ REFLEX TO MG FOR LOW K
ALT: 469 U/L — ABNORMAL HIGH (ref 10–50)
AST: 373 U/L — ABNORMAL HIGH (ref 10–50)
Albumin/Globulin Ratio: 0.9 — ABNORMAL LOW (ref 1.1–2.2)
Albumin: 3.1 g/dL — ABNORMAL LOW (ref 3.5–5.2)
Alk Phosphatase: 130 U/L — ABNORMAL HIGH (ref 40–129)
Anion Gap: 9 mmol/L (ref 2–14)
BUN/Creatinine Ratio: 6 — ABNORMAL LOW (ref 12–20)
BUN: 6 mg/dL (ref 6–20)
CO2: 24 mmol/L (ref 20–29)
Calcium: 9.1 mg/dL (ref 8.6–10.0)
Chloride: 104 mmol/L (ref 98–107)
Creatinine: 0.89 mg/dL (ref 0.70–1.20)
Est, Glom Filt Rate: >90 ml/min/1.73m2 (ref >90)
Globulin: 3.6 g/dL (ref 2.0–4.0)
Glucose: 88 mg/dL (ref 65–100)
Potassium: 4.4 mmol/L (ref 3.5–5.1)
Sodium: 137 mmol/L (ref 136–145)
Total Bilirubin: 0.4 mg/dL (ref 0.0–1.2)
Total Protein: 6.7 g/dL (ref 6.4–8.3)

## 2023-09-04 LAB — LIPASE: Lipase: 41 U/L (ref 13–60)

## 2023-09-04 MED ORDER — LORAZEPAM 2 MG/ML IJ SOLN
2 | INTRAMUSCULAR | Status: DC | PRN
Start: 2023-09-04 — End: 2023-09-04

## 2023-09-04 MED ORDER — LORAZEPAM 1 MG PO TABS
1 | ORAL | Status: AC | PRN
Start: 2023-09-04 — End: 2023-09-11
  Administered 2023-09-06: 03:00:00 1 mg via ORAL

## 2023-09-04 MED ORDER — LORAZEPAM 1 MG PO TABS
1 | ORAL | Status: AC | PRN
Start: 2023-09-04 — End: 2023-09-11

## 2023-09-04 MED ORDER — LORAZEPAM 1 MG PO TABS
1 | ORAL | Status: AC | PRN
Start: 2023-09-04 — End: 2023-09-11
  Administered 2023-09-04 – 2023-09-07 (×3): 2 mg via ORAL

## 2023-09-04 MED ORDER — DIAZEPAM 5 MG/ML IJ SOLN
5 | INTRAMUSCULAR | Status: AC | PRN
Start: 2023-09-04 — End: 2023-09-11

## 2023-09-04 MED ORDER — DIAZEPAM 5 MG/ML IJ SOLN
5 | INTRAMUSCULAR | Status: AC | PRN
Start: 2023-09-04 — End: 2023-09-11
  Administered 2023-09-04 – 2023-09-09 (×29): 10 mg via INTRAVENOUS

## 2023-09-04 MED ORDER — LORAZEPAM 1 MG PO TABS
1 | ORAL | Status: DC | PRN
Start: 2023-09-04 — End: 2023-09-04

## 2023-09-04 MED ORDER — DIAZEPAM 5 MG PO TABS
5 | Freq: Three times a day (TID) | ORAL | Status: DC
Start: 2023-09-04 — End: 2023-09-06
  Administered 2023-09-05: 16:00:00 5 mg via ORAL

## 2023-09-04 MED ORDER — DIAZEPAM 5 MG/ML IJ SOLN
5 | INTRAMUSCULAR | Status: AC | PRN
Start: 2023-09-04 — End: 2023-09-11
  Administered 2023-09-09 (×3): 15 mg via INTRAVENOUS

## 2023-09-04 MED ORDER — DIAZEPAM 5 MG/ML IJ SOLN
5 | INTRAMUSCULAR | Status: DC | PRN
Start: 2023-09-04 — End: 2023-09-04

## 2023-09-04 MED FILL — DIAZEPAM 5 MG/ML IJ SOLN: 5 mg/mL | INTRAMUSCULAR | Qty: 2 | Fill #0

## 2023-09-04 MED FILL — PHENOBARBITAL 32.4 MG PO TABS: 32.4 mg | ORAL | Qty: 1 | Fill #0

## 2023-09-04 MED FILL — THIAMINE HCL 100 MG PO TABS: 100 mg | ORAL | Qty: 1 | Fill #0

## 2023-09-04 MED FILL — ENOXAPARIN SODIUM 40 MG/0.4ML IJ SOSY: 40 MG/0.4ML | INTRAMUSCULAR | Qty: 0.4 | Fill #0

## 2023-09-04 MED FILL — FOLIC ACID 1 MG PO TABS: 1 mg | ORAL | Qty: 1 | Fill #0

## 2023-09-04 MED FILL — LORAZEPAM 1 MG PO TABS: 1 mg | ORAL | Qty: 2 | Fill #0

## 2023-09-04 MED FILL — ONDANSETRON HCL 4 MG/2ML IJ SOLN: 4 MG/2ML | INTRAMUSCULAR | Qty: 2 | Fill #0

## 2023-09-04 MED FILL — SERTRALINE HCL 50 MG PO TABS: 50 mg | ORAL | Qty: 1 | Fill #0

## 2023-09-04 MED FILL — THERA PO TABS: ORAL | Qty: 1 | Fill #0

## 2023-09-04 NOTE — Plan of Care (Signed)
 Problem: Risk for Elopement  Goal: Patient will not exit the unit/facility without proper excort  09/04/2023 0441 by Vanderbilt Maxwell, RN  Outcome: Progressing  09/04/2023 0440 by Vanderbilt Maxwell, RN  Outcome: Progressing     Problem: ABCDS Injury Assessment  Goal: Absence of physical injury  09/04/2023 0441 by Vanderbilt Maxwell, RN  Outcome: Progressing  09/04/2023 0440 by Vanderbilt Maxwell, RN  Outcome: Progressing     Problem: Discharge Planning  Goal: Discharge to home or other facility with appropriate resources  09/04/2023 0441 by Vanderbilt Maxwell, RN  Outcome: Progressing  09/04/2023 0440 by Vanderbilt Maxwell, RN  Outcome: Progressing     Problem: Seizure Precautions  Goal: Remains free of injury related to seizures activity  09/04/2023 0441 by Vanderbilt Maxwell, RN  Outcome: Progressing  09/04/2023 0440 by Vanderbilt Maxwell, RN  Outcome: Progressing

## 2023-09-04 NOTE — ED Notes (Signed)
 ED TO INPATIENT SBAR HANDOFF     Patient Name: Adam Bell   Preferred Name: [name]  DOB: [birth year]  38 y.o.             Family/Caregiver Present: no   Code Status Order: Full Code  PO Status:@DIETORDS @  Telemetry Order: Yes  C-SSRS: Risk of Suicide: No Risk  Sitter no    Restraints:    Sepsis Risk Score Sepsis V2 Risk Score: 5    Situation:  Chief Complaint   Patient presents with    Alcohol Problem     Brief Description of Patient's Condition: Client reports detox from ETOH, reports drinking day over several years. Last drink yesterday at 1500. CIWA of 5, vitals stable. Given Valium  for anxiety. NAD. AXOX4.   Mental Status: oriented  Arrived from: Home  Abnormal labs:   Abnormal Labs Reviewed   CBC WITH AUTO DIFFERENTIAL - Abnormal; Notable for the following components:       Result Value    Basophils % 1.4 (*)     Immature Granulocytes % 0.7 (*)     Basophils Absolute 0.11 (*)     Immature Granulocytes Absolute 0.05 (*)     All other components within normal limits   COMPREHENSIVE METABOLIC PANEL - Abnormal; Notable for the following components:    BUN 5 (*)     BUN/Creatinine Ratio 6 (*)     ALT 371 (*)     AST 296 (*)     Alk Phosphatase 138 (*)     Globulin 4.4 (*)     Albumin/Globulin Ratio 0.8 (*)     All other components within normal limits   ETHANOL - Abnormal; Notable for the following components:    Ethanol Lvl 121 (*)     All other components within normal limits   LIPASE - Abnormal; Notable for the following components:    Lipase 229 (*)     All other components within normal limits   URINE DRUG SCREEN - Abnormal; Notable for the following components:    Barbiturates, Urine Positive (*)     Benzodiazepines, Urine Positive (*)     All other components within normal limits   URINALYSIS WITH REFLEX TO CULTURE - Abnormal; Notable for the following components:    Ketones, Urine TRACE (*)     All other components within normal limits   COMPREHENSIVE METABOLIC PANEL W/ REFLEX TO MG FOR LOW K  - Abnormal; Notable for the following components:    BUN/Creatinine Ratio 8 (*)     Calcium 8.5 (*)     ALT 336 (*)     AST 258 (*)     Albumin 2.8 (*)     Albumin/Globulin Ratio 0.8 (*)     All other components within normal limits   CBC WITH AUTO DIFFERENTIAL - Abnormal; Notable for the following components:    Basophils % 1.8 (*)     Immature Granulocytes % 0.6 (*)     Basophils Absolute 0.12 (*)     All other components within normal limits        Background:  Allergies:   Allergies   Allergen Reactions    Vancomycin Rash     History: No past medical history on file.     Assessment:  Vitals/MEWS: MEWS Score: 1  Level of Consciousness: Alert (0)   Vitals:    09/04/23 0004 09/04/23 0023 09/04/23 0030 09/04/23 0055   BP: 125/85  125/80 124/82  Pulse: 85 69  65   Resp: 20 19  16    Temp:       TempSrc:       SpO2: 94% 94%  93%   Weight:       Height:         Cardiac Rhythm:       Deterioration Index:  O2 Flow Rate:    O2 Device: O2 Device: None (Room air)  Critical Lab Results: @LABCR @  Cultures:    NIH Score: NIH NIH Stroke Scale  NIH Stroke Scale Assessed: No   Active LDA's:   Patient Lines/Drains/Airways Status       Active LDAs       Name Placement date Placement time Site Days    Peripheral IV 09/02/23 Left Antecubital 09/02/23  1905  Antecubital  1                  Active Central Lines:                            Active Wounds:    Active Foley's:    Active Feeding Tubes:        Administered Medications:   Medications   sodium chloride  flush 0.9 % injection 5-40 mL (10 mLs IntraVENous Given 09/03/23 2143)   sodium chloride  flush 0.9 % injection 5-40 mL (has no administration in time range)   0.9 % sodium chloride  infusion (has no administration in time range)   thiamine  tablet 100 mg (100 mg Oral Given 09/03/23 0915)   folic acid  (FOLVITE ) tablet 1 mg (1 mg Oral Given 09/03/23 0915)   multivitamin 1 tablet (1 tablet Oral Given 09/03/23 0915)   PHENobarbital  tablet 64.8 mg (64.8 mg Oral Given 09/03/23 1736)     Followed  by   PHENobarbital  tablet 32.4 mg (32.4 mg Oral Given 09/03/23 2143)     Followed by   PHENobarbital  tablet 32.4 mg (has no administration in time range)     Followed by   PHENobarbital  tablet 16.2 mg (has no administration in time range)   sodium chloride  flush 0.9 % injection 5-40 mL (10 mLs IntraVENous Given 09/03/23 2143)   sodium chloride  flush 0.9 % injection 5-40 mL (has no administration in time range)   0.9 % sodium chloride  infusion (has no administration in time range)   ondansetron  (ZOFRAN -ODT) disintegrating tablet 4 mg ( Oral See Alternative 09/03/23 1513)     Or   ondansetron  (ZOFRAN ) injection 4 mg (4 mg IntraVENous Given 09/03/23 1513)   polyethylene glycol (GLYCOLAX ) packet 17 g (has no administration in time range)   enoxaparin  (LOVENOX ) injection 40 mg (40 mg SubCUTAneous Given 09/03/23 0917)   lactated ringers  infusion ( IntraVENous New Bag 09/03/23 1419)   sertraline  (ZOLOFT ) tablet 50 mg (50 mg Oral Given 09/03/23 1513)   diazePAM  (VALIUM ) injection 5 mg (5 mg IntraVENous Given 09/03/23 2044)   sodium chloride  0.9 % bolus 1,000 mL (0 mLs IntraVENous Stopped 09/02/23 2300)   ondansetron  (ZOFRAN ) injection 4 mg (4 mg IntraVENous Given 09/02/23 2059)     Last documented pain medication administration:    Pertinent or High Risk Medications/Drips: no   o If Yes, please provide details:    Blood Product Administration: no  o If Yes, please provide details:      Process Protocols/Bundles:     Patient Belongings:      Additional Comments:    If any further questions, please  call Sending RN at [phone]  Opportunity for questions and clarification were provided.     Electronically signed by: Geremy Rister, RN

## 2023-09-04 NOTE — Plan of Care (Signed)
 Problem: Risk for Elopement  Goal: Patient will not exit the unit/facility without proper excort  09/04/2023 1055 by Gwenyth Mccreedy, RN  Outcome: Progressing  09/04/2023 0441 by Vanderbilt Maxwell, RN  Outcome: Progressing  09/04/2023 0440 by Vanderbilt Maxwell, RN  Outcome: Progressing     Problem: ABCDS Injury Assessment  Goal: Absence of physical injury  09/04/2023 1055 by Gwenyth Mccreedy, RN  Outcome: Progressing  09/04/2023 0441 by Vanderbilt Maxwell, RN  Outcome: Progressing  09/04/2023 0440 by Vanderbilt Maxwell, RN  Outcome: Progressing     Problem: Discharge Planning  Goal: Discharge to home or other facility with appropriate resources  09/04/2023 1055 by Gwenyth Mccreedy, RN  Outcome: Progressing  09/04/2023 0441 by Vanderbilt Maxwell, RN  Outcome: Progressing  09/04/2023 0440 by Vanderbilt Maxwell, RN  Outcome: Progressing     Problem: Seizure Precautions  Goal: Remains free of injury related to seizures activity  09/04/2023 1055 by Gwenyth Mccreedy, RN  Outcome: Progressing  09/04/2023 0441 by Vanderbilt Maxwell, RN  Outcome: Progressing  09/04/2023 0440 by Vanderbilt Maxwell, RN  Outcome: Progressing     Problem: Safety - Adult  Goal: Free from fall injury  09/04/2023 1055 by Gwenyth Mccreedy, RN  Outcome: Progressing  09/04/2023 0441 by Vanderbilt Maxwell, RN  Outcome: Progressing  09/04/2023 0440 by Vanderbilt Maxwell, RN  Outcome: Progressing     Problem: Pain  Goal: Verbalizes/displays adequate comfort level or baseline comfort level  Outcome: Progressing

## 2023-09-04 NOTE — Plan of Care (Signed)
 Problem: Risk for Elopement  Goal: Patient will not exit the unit/facility without proper excort  Outcome: Progressing     Problem: ABCDS Injury Assessment  Goal: Absence of physical injury  Outcome: Progressing     Problem: Discharge Planning  Goal: Discharge to home or other facility with appropriate resources  Outcome: Progressing     Problem: Seizure Precautions  Goal: Remains free of injury related to seizures activity  Outcome: Progressing     Problem: Safety - Adult  Goal: Free from fall injury  Outcome: Progressing

## 2023-09-04 NOTE — Progress Notes (Signed)
 [facility]                                                                                          Hospitalist Progress Note  Adam FORBES Boeck, MD  Answering service: [phone] OR [phone] from in house phone        Date of Service:  09/04/2023  NAME:  [facility]  DOB:  01-Sep-1985  MRN:  [id number]       Admission Summary:   [facility] is a 38 y.o. male with past medical history of anxiety, depression, alcohol dependence presented to the ED with chief complaint of alcohol intoxication.  Patient reportedly drinks 2 large bottles of wine per day with last drink about 2 hours prior to admission.  He reportedly was tremulous, having headaches, nausea, pins-and-needles feeling in hands and feet.  He reportedly had used Ativan , Librium, Valium  on with concern for alcohol withdrawal without relief of symptoms.  Symptoms noted remain constant, severe, without specific alleviating factors.  Patient was last hospitalized at [facility] from 08/07/2023 to 08/08/2023 with diagnosis of alcohol intoxication and relapsed alcoholic.  Patient reported was discharged with substance use rehabilitation program with plan for Librium taper in addition to naltrexone with scheduled appointment to an addiction medicine clinic on 08/12/2023.  On arrival to ED, initial reported vital signs were temperature 98.0 F, BP 110/78, heart rate 75, respiratory rate 16, O2 saturation 95% on room air.  Abnormal labs included alkaline phosphatase 138, ALT 371, AST 296, lipase 229, alcohol level 121.  CT head without IV contrast showed no acute intracranial abnormalities.  CT chest without IV contrast showed mild patchy infiltrate in the right upper lobe and minimal infiltrate in the left upper lobe which may be secondary to aspiration or pneumonia with no acute bony abnormalities.  CT abdomen pelvis without IV contrast showed punctate bilateral renal calculi (nonobstructive), hepatic  steatosis, colonic diverticulosis, fecal stasis, but no acute intraperitoneal process.  Patient was multivitamin, thiamine , folic acid  and started on phenobarbital  taper.  Patient is now seen for admission to the hospitalist service..  Patient was prior history of alcohol withdrawal seizures.   Last drink was 8/08.     Interval history / Subjective:   Patient seen and examined.  He has repeatedly requested for the dose of Valium  to be increased from 5 mg to 10 mg.  Attempted to switch the CIWA to Ativan , but unable to due to shortages.  Will continue to use Valium  as needed to control symptoms of withdrawal.  I will also continue phenobarbital , even though patient does not feel it is working.  He has minimal tremors of his hands.      Assessment & Plan:         Alcohol withdrawal syndrome with complication (HCC)  - Ethanol level = 121 mg/dL  - Already started on CIWA alcohol withdrawal protocol with phenobarbital  taper per ED  - Encourage alcohol cessation  - Multivitamin, thiamine , folic acid  daily supplementation;     2.  Elevated LFTs  Hepatic steatosis  Acute alcoholic hepatitis  - Alkaline  phosphatase 138, ALT 371, AST 296, lipase 229, alcohol level 121.   - Trend     3.  Elevated lipase  - Acute pancreatitis, possible but non-tender abdominal exam and no related GI symptoms  - Lipase = 229 --> normalized overnight;  -No abdominal pain, no nausea or vomiting, tolerating diet;     4.  Pneumonia of both lungs due to infectious organism --> ruled out  - CT chest without IV contrast showed mild patchy infiltrate in the right upper lobe and minimal infiltrate in the left upper lobe which may be secondary to aspiration or pneumonia   - Patient started on empiric antibiotics, but he is afebrile, has no leukocytosis, not coughing or short of breath; discontinue antibiotics and monitor clinically;     5.  Anxiety       Depression  - Resume home medications;     6.  Vaping  - Encouraged to stop vaping     7.  Renal  calculi  - Punctate nonobstructive bilateral renal calculi per CT report     8.  Fecal stasis  - Ordered bowel regimen/stool softener       Code status: Full  Prophylaxis: Bilateral SCDs  Care Plan discussed with: Patient, RN;  Anticipated Disposition: TBD  Inpatient  Cardiac monitoring: Telemetry    Patient at moderate risk of deterioration/increased CIWA score, keep on intermediate level of care;          Principal Problem:    Alcohol withdrawal syndrome with complication (HCC)  Active Problems:    Pneumonia of both lungs due to infectious organism  Resolved Problems:    * No resolved hospital problems. *         Social Drivers of Health     Tobacco Use: High Risk (09/04/2023)    Patient History     Smoking Tobacco Use: Former     Smokeless Tobacco Use: Current     Passive Exposure: Past   Alcohol Use: Alcohol Misuse (08/24/2023)    AUDIT-C     Frequency of Alcohol Consumption: 4 or more times a week     Average Number of Drinks: 5 or 6     Frequency of Binge Drinking: Daily or almost daily   Financial Resource Strain: Not on file   Food Insecurity: Not on file (09/04/2023)   Transportation Needs: No Transportation Needs (09/04/2023)    PRAPARE - Therapist, art (Medical): No     Lack of Transportation (Non-Medical): No   Physical Activity: Not on file   Stress: Not on file   Social Connections: Not on file   Intimate Partner Violence: Not on file   Depression: Not on file   Housing Stability: Low Risk  (09/04/2023)    Housing Stability Vital Sign     Unable to Pay for Housing in the Last Year: No     Number of Times Moved in the Last Year: 0     Homeless in the Last Year: No   Interpersonal Safety: Not At Risk (09/04/2023)    Interpersonal Safety Domain Source: IP Abuse Screening     Physical abuse: Denies     Verbal abuse: Denies     Emotional abuse: Denies     Financial abuse: Denies     Sexual abuse: Denies   Utilities: Not At Risk (09/04/2023)    AHC Utilities     Threatened with loss of  utilities: No  Review of Systems:   Pertinent items are noted in HPI.       Vital Signs:    Last 24hrs VS reviewed since prior progress note. Most recent are:  Vitals:    09/04/23 1000   BP: 118/78   Pulse:    Resp:    Temp:    SpO2:          Intake/Output Summary (Last 24 hours) at 09/04/2023 1144  Last data filed at 09/04/2023 1025  Gross per 24 hour   Intake 2145.33 ml   Output 1250 ml   Net 895.33 ml        Physical Examination:     I had a face to face encounter with this patient and independently examined them on 09/04/2023 as outlined below:          General : alert x 3, awake, no acute distress,   HEENT: EOMI, moist mucus membrane, TM clear  Neck: supple, no JVD, no meningeal signs  Chest: Clear to auscultation bilaterally   CVS: S1 S2 heard, Capillary refill less than 2 seconds  Abd: soft/ non tender, non distended, BS physiological,   Ext: no clubbing, no cyanosis, no edema, brisk 2+ DP pulses  Neuro/Psych: pleasant mood and affect, CN 2-12 grossly intact, Strength 5/5 in all extremities,  Resting and intentional tremors in both hands  Skin: warm     Data Review:    Review and/or order of clinical lab test  Review and/or order of tests in the radiology section of CPT  Review and/or order of tests in the medicine section of CPT      I have personally and independently reviewed all pertinent labs, diagnostic studies, imaging, and have provided independent interpretation of the same.     Labs:     Recent Labs     09/03/23  0614 09/04/23  0418   WBC 6.8 7.3   HGB 14.0 14.7   HCT 42.9 43.3   PLT 264 278     Recent Labs     09/02/23  1904 09/03/23  0614 09/04/23  0418   NA 140 140 137   K 4.3 4.1 4.4   CL 107 107 104   CO2 21 21 24    BUN 5* 8 6     Recent Labs     09/02/23  1904 09/03/23  0614 09/04/23  0418   ALT 371* 336* 469*   GLOB 4.4* 3.5 3.6     No results for input(s): INR, APTT in the last 72 hours.    Invalid input(s): PTP   No results for input(s): TIBC in the last 72 hours.    Invalid  input(s): FE, PSAT, FERR   No results found for: RBCF   No results for input(s): PH, PCO2, PO2 in the last 72 hours.  No results for input(s): CPK in the last 72 hours.    Invalid input(s): CPKMB, CKNDX, TROIQ  No results found for: CHOL, CHLST, CHOLV, HDL, HDLC, LDL, LDLC  No results found for: GLUCPOC  @LABUA @    Notes reviewed from all clinical/nonclinical/nursing services involved in patient's clinical care. Care coordination discussions were held with appropriate clinical/nonclinical/ nursing providers based on care coordination needs.         Patients current active Medications were reviewed, considered, added and adjusted based on the clinical condition today.      Home Medications were reconciled to the best of my ability given all available resources at the time of  admission. Route is PO if not otherwise noted.      Medications Reviewed:     Current Facility-Administered Medications   Medication Dose Route Frequency    LORazepam  (ATIVAN ) tablet 1 mg  1 mg Oral Q1H PRN    Or    LORazepam  (ATIVAN ) injection 1 mg  1 mg IntraVENous Q1H PRN    Or    LORazepam  (ATIVAN ) tablet 2 mg  2 mg Oral Q1H PRN    Or    LORazepam  (ATIVAN ) injection 2 mg  2 mg IntraVENous Q1H PRN    Or    LORazepam  (ATIVAN ) tablet 3 mg  3 mg Oral Q1H PRN    Or    LORazepam  (ATIVAN ) injection 3 mg  3 mg IntraVENous Q1H PRN    Or    LORazepam  (ATIVAN ) tablet 4 mg  4 mg Oral Q1H PRN    Or    LORazepam  (ATIVAN ) injection 4 mg  4 mg IntraVENous Q1H PRN    diazePAM  (VALIUM ) tablet 5 mg  5 mg Oral TID    lactated ringers  infusion   IntraVENous Continuous    sertraline  (ZOLOFT ) tablet 50 mg  50 mg Oral Daily    sodium chloride  flush 0.9 % injection 5-40 mL  5-40 mL IntraVENous 2 times per day    sodium chloride  flush 0.9 % injection 5-40 mL  5-40 mL IntraVENous PRN    0.9 % sodium chloride  infusion   IntraVENous PRN    thiamine  tablet 100 mg  100 mg Oral Daily    folic acid  (FOLVITE ) tablet 1 mg  1 mg Oral Daily     multivitamin 1 tablet  1 tablet Oral Daily    PHENobarbital  tablet 32.4 mg  32.4 mg Oral 4x daily    Followed by    PHENobarbital  tablet 32.4 mg  32.4 mg Oral BID    Followed by    NOREEN ON 09/05/2023] PHENobarbital  tablet 16.2 mg  16.2 mg Oral BID    sodium chloride  flush 0.9 % injection 5-40 mL  5-40 mL IntraVENous 2 times per day    sodium chloride  flush 0.9 % injection 5-40 mL  5-40 mL IntraVENous PRN    0.9 % sodium chloride  infusion   IntraVENous PRN    ondansetron  (ZOFRAN -ODT) disintegrating tablet 4 mg  4 mg Oral Q8H PRN    Or    ondansetron  (ZOFRAN ) injection 4 mg  4 mg IntraVENous Q6H PRN    polyethylene glycol (GLYCOLAX ) packet 17 g  17 g Oral Daily PRN    enoxaparin  (LOVENOX ) injection 40 mg  40 mg SubCUTAneous Daily     ______________________________________________________________________  EXPECTED LENGTH OF STAY: Unable to retrieve estimated LOS  ACTUAL LENGTH OF STAY:          2                 Clarisse Rodriges E Adeyemi Hamad, MD

## 2023-09-05 LAB — EKG 12-LEAD
Atrial Rate: 78 {beats}/min
Diagnosis: NORMAL
P Axis: 28 degrees
P-R Interval: 144 ms
Q-T Interval: 358 ms
QRS Duration: 100 ms
QTc Calculation (Bazett): 408 ms
R Axis: 50 degrees
T Axis: 10 degrees
Ventricular Rate: 78 {beats}/min

## 2023-09-05 MED FILL — DIAZEPAM 5 MG PO TABS: 5 mg | ORAL | Qty: 1 | Fill #0

## 2023-09-05 MED FILL — ONDANSETRON HCL 4 MG/2ML IJ SOLN: 4 MG/2ML | INTRAMUSCULAR | Qty: 2 | Fill #0

## 2023-09-05 MED FILL — PHENOBARBITAL 32.4 MG PO TABS: 32.4 mg | ORAL | Qty: 1 | Fill #0

## 2023-09-05 MED FILL — SERTRALINE HCL 50 MG PO TABS: 50 mg | ORAL | Qty: 1 | Fill #0

## 2023-09-05 MED FILL — DIAZEPAM 5 MG/ML IJ SOLN: 5 mg/mL | INTRAMUSCULAR | Qty: 2 | Fill #0

## 2023-09-05 MED FILL — THERA PO TABS: ORAL | Qty: 1 | Fill #0

## 2023-09-05 MED FILL — THIAMINE HCL 100 MG PO TABS: 100 mg | ORAL | Qty: 1 | Fill #0

## 2023-09-05 MED FILL — ENOXAPARIN SODIUM 40 MG/0.4ML IJ SOSY: 40 MG/0.4ML | INTRAMUSCULAR | Qty: 0.4 | Fill #0

## 2023-09-05 MED FILL — FOLIC ACID 1 MG PO TABS: 1 mg | ORAL | Qty: 1 | Fill #0

## 2023-09-05 NOTE — Progress Notes (Signed)
 [facility]                                                                                          Hospitalist Progress Note  Carlos FORBES Boeck, MD  Answering service: [phone] OR [phone] from in house phone        Date of Service:  09/05/2023  NAME:  [facility]  DOB:  [birth year]  MRN:  [id number]       Admission Summary:   [facility] is a 38 y.o. male with past medical history of anxiety, depression, alcohol dependence presented to the ED with chief complaint of alcohol intoxication.  Patient reportedly drinks 2 large bottles of wine per day with last drink about 2 hours prior to admission.  He reportedly was tremulous, having headaches, nausea, pins-and-needles feeling in hands and feet.  He reportedly had used Ativan , Librium, Valium  on with concern for alcohol withdrawal without relief of symptoms.  Symptoms noted remain constant, severe, without specific alleviating factors.  Patient was last hospitalized at [facility] from 08/07/2023 to 08/08/2023 with diagnosis of alcohol intoxication and relapsed alcoholic.  Patient reported was discharged with substance use rehabilitation program with plan for Librium taper in addition to naltrexone with scheduled appointment to an addiction medicine clinic on 08/12/2023.  On arrival to ED, initial reported vital signs were temperature 98.0 F, BP 110/78, heart rate 75, respiratory rate 16, O2 saturation 95% on room air.  Abnormal labs included alkaline phosphatase 138, ALT 371, AST 296, lipase 229, alcohol level 121.  CT head without IV contrast showed no acute intracranial abnormalities.  CT chest without IV contrast showed mild patchy infiltrate in the right upper lobe and minimal infiltrate in the left upper lobe which may be secondary to aspiration or pneumonia with no acute bony abnormalities.  CT abdomen pelvis without IV contrast showed punctate bilateral renal calculi (nonobstructive), hepatic  steatosis, colonic diverticulosis, fecal stasis, but no acute intraperitoneal process.  Patient was multivitamin, thiamine , folic acid  and started on phenobarbital  taper.  Patient is now seen for admission to the hospitalist service..  Patient was prior history of alcohol withdrawal seizures.   Last drink was 8/08.     Interval history / Subjective:   Patient seen and examined.  Awake, cooperative.  Not tachycardic.  Vitals stable.  Lab work stable.  LFTs still elevated.  Had an episode of nausea/vomiting after eating lunch, Zofran  helped.     Assessment & Plan:         Alcohol withdrawal syndrome with complication (HCC)  - Ethanol level = 121 mg/dL  - Already started on CIWA alcohol withdrawal protocol with phenobarbital  taper per ED  - Encourage alcohol cessation  - Multivitamin, thiamine , folic acid  daily supplementation;     2.  Elevated LFTs  Hepatic steatosis  Acute alcoholic hepatitis  - Alkaline phosphatase 138, ALT 371, AST 296, lipase 229, alcohol level 121.   - Trend     3.  Elevated lipase  - Acute pancreatitis, possible but non-tender abdominal exam and no related GI symptoms  - Lipase = 229 -->  normalized overnight;  -No abdominal pain, no nausea or vomiting, tolerating diet;     4.  Pneumonia of both lungs due to infectious organism --> ruled out  - CT chest without IV contrast showed mild patchy infiltrate in the right upper lobe and minimal infiltrate in the left upper lobe which may be secondary to aspiration or pneumonia   - Patient started on empiric antibiotics, but he is afebrile, has no leukocytosis, not coughing or short of breath; discontinue antibiotics and monitor clinically;     5.  Anxiety       Depression  - Resume home medications;     6.  Vaping  - Encouraged to stop vaping     7.  Renal calculi  - Punctate nonobstructive bilateral renal calculi per CT report     8.  Fecal stasis  - Ordered bowel regimen/stool softener       Code status: Full  Prophylaxis: Bilateral SCDs  Care Plan  discussed with: Patient, RN;  Anticipated Disposition: TBD  Inpatient  Cardiac monitoring: Telemetry    Patient at moderate risk of deterioration/increased CIWA score, keep on intermediate level of care;          Principal Problem:    Alcohol withdrawal syndrome with complication (HCC)  Active Problems:    Pneumonia of both lungs due to infectious organism  Resolved Problems:    * No resolved hospital problems. *         Social Drivers of Health     Tobacco Use: High Risk (09/04/2023)    Patient History     Smoking Tobacco Use: Former     Smokeless Tobacco Use: Current     Passive Exposure: Past   Alcohol Use: Alcohol Misuse (08/24/2023)    AUDIT-C     Frequency of Alcohol Consumption: 4 or more times a week     Average Number of Drinks: 5 or 6     Frequency of Binge Drinking: Daily or almost daily   Financial Resource Strain: Not on file   Food Insecurity: Not on file (09/04/2023)   Transportation Needs: No Transportation Needs (09/04/2023)    PRAPARE - Therapist, art (Medical): No     Lack of Transportation (Non-Medical): No   Physical Activity: Not on file   Stress: Not on file   Social Connections: Not on file   Intimate Partner Violence: Not on file   Depression: Not on file   Housing Stability: Low Risk  (09/04/2023)    Housing Stability Vital Sign     Unable to Pay for Housing in the Last Year: No     Number of Times Moved in the Last Year: 0     Homeless in the Last Year: No   Interpersonal Safety: Not At Risk (09/04/2023)    Interpersonal Safety Domain Source: IP Abuse Screening     Physical abuse: Denies     Verbal abuse: Denies     Emotional abuse: Denies     Financial abuse: Denies     Sexual abuse: Denies   Utilities: Not At Risk (09/04/2023)    AHC Utilities     Threatened with loss of utilities: No       Review of Systems:   Pertinent items are noted in HPI.       Vital Signs:    Last 24hrs VS reviewed since prior progress note. Most recent are:  Vitals:    09/05/23 1200   BP:  Pulse: 74   Resp:    Temp:    SpO2:          Intake/Output Summary (Last 24 hours) at 09/05/2023 1329  Last data filed at 09/05/2023 9185  Gross per 24 hour   Intake 1664.32 ml   Output 2870 ml   Net -1205.68 ml        Physical Examination:     I had a face to face encounter with this patient and independently examined them on 09/05/2023 as outlined below:          General : alert x 3, awake, no acute distress,   HEENT: EOMI, moist mucus membrane, TM clear  Neck: supple, no JVD, no meningeal signs  Chest: Clear to auscultation bilaterally   CVS: S1 S2 heard, Capillary refill less than 2 seconds  Abd: soft/ non tender, non distended, BS physiological,   Ext: no clubbing, no cyanosis, no edema, brisk 2+ DP pulses  Neuro/Psych: pleasant mood and affect, CN 2-12 grossly intact, Strength 5/5 in all extremities,  Resting and intentional tremors in both hands  Skin: warm     Data Review:    Review and/or order of clinical lab test  Review and/or order of tests in the radiology section of CPT  Review and/or order of tests in the medicine section of CPT      I have personally and independently reviewed all pertinent labs, diagnostic studies, imaging, and have provided independent interpretation of the same.     Labs:     Recent Labs     09/03/23  0614 09/04/23  0418   WBC 6.8 7.3   HGB 14.0 14.7   HCT 42.9 43.3   PLT 264 278     Recent Labs     09/02/23  1904 09/03/23  0614 09/04/23  0418   NA 140 140 137   K 4.3 4.1 4.4   CL 107 107 104   CO2 21 21 24    BUN 5* 8 6     Recent Labs     09/02/23  1904 09/03/23  0614 09/04/23  0418   ALT 371* 336* 469*   GLOB 4.4* 3.5 3.6     No results for input(s): INR, APTT in the last 72 hours.    Invalid input(s): PTP   No results for input(s): TIBC in the last 72 hours.    Invalid input(s): FE, PSAT, FERR   No results found for: RBCF   No results for input(s): PH, PCO2, PO2 in the last 72 hours.  No results for input(s): CPK in the last 72 hours.    Invalid  input(s): CPKMB, CKNDX, TROIQ  No results found for: CHOL, CHLST, CHOLV, HDL, HDLC, LDL, LDLC  No results found for: GLUCPOC  @LABUA @    Notes reviewed from all clinical/nonclinical/nursing services involved in patient's clinical care. Care coordination discussions were held with appropriate clinical/nonclinical/ nursing providers based on care coordination needs.         Patients current active Medications were reviewed, considered, added and adjusted based on the clinical condition today.      Home Medications were reconciled to the best of my ability given all available resources at the time of admission. Route is PO if not otherwise noted.      Medications Reviewed:     Current Facility-Administered Medications   Medication Dose Route Frequency    diazePAM  (VALIUM ) tablet 5 mg  5 mg Oral TID    LORazepam  (ATIVAN )  tablet 1 mg  1 mg Oral Q1H PRN    Or    diazePAM  (VALIUM ) injection 5 mg  5 mg IntraVENous Q1H PRN    Or    LORazepam  (ATIVAN ) tablet 2 mg  2 mg Oral Q1H PRN    Or    diazePAM  (VALIUM ) injection 10 mg  10 mg IntraVENous Q1H PRN    Or    LORazepam  (ATIVAN ) tablet 3 mg  3 mg Oral Q1H PRN    Or    diazePAM  (VALIUM ) injection 15 mg  15 mg IntraVENous Q1H PRN    Or    LORazepam  (ATIVAN ) tablet 4 mg  4 mg Oral Q1H PRN    Or    diazePAM  (VALIUM ) injection 20 mg  20 mg IntraVENous Q1H PRN    lactated ringers  infusion   IntraVENous Continuous    sertraline  (ZOLOFT ) tablet 50 mg  50 mg Oral Daily    sodium chloride  flush 0.9 % injection 5-40 mL  5-40 mL IntraVENous 2 times per day    sodium chloride  flush 0.9 % injection 5-40 mL  5-40 mL IntraVENous PRN    0.9 % sodium chloride  infusion   IntraVENous PRN    thiamine  tablet 100 mg  100 mg Oral Daily    folic acid  (FOLVITE ) tablet 1 mg  1 mg Oral Daily    multivitamin 1 tablet  1 tablet Oral Daily    PHENobarbital  tablet 16.2 mg  16.2 mg Oral BID    sodium chloride  flush 0.9 % injection 5-40 mL  5-40 mL IntraVENous 2 times per day    sodium  chloride flush 0.9 % injection 5-40 mL  5-40 mL IntraVENous PRN    0.9 % sodium chloride  infusion   IntraVENous PRN    ondansetron  (ZOFRAN -ODT) disintegrating tablet 4 mg  4 mg Oral Q8H PRN    Or    ondansetron  (ZOFRAN ) injection 4 mg  4 mg IntraVENous Q6H PRN    polyethylene glycol (GLYCOLAX ) packet 17 g  17 g Oral Daily PRN    enoxaparin  (LOVENOX ) injection 40 mg  40 mg SubCUTAneous Daily     ______________________________________________________________________  EXPECTED LENGTH OF STAY: Unable to retrieve estimated LOS  ACTUAL LENGTH OF STAY:          3                 Syanna Remmert E Nakea Gouger, MD

## 2023-09-05 NOTE — Care Coordination-Inpatient (Signed)
 Care Management Initial Assessment       RUR: 7%  Readmission? No  1st IM letter given? No  1st [employer] letter given: No        09/05/23 1537   Service Assessment   Patient Orientation Alert and Oriented   Cognition Alert   History Provided By Patient   Primary Caregiver Self   Support Systems Family Members   Patient's Healthcare Decision Maker is: Legal Next of Kin   PCP Verified by CM No  (Patient needs new PCP)   Prior Functional Level Independent in ADLs/IADLs   Current Functional Level Independent in ADLs/IADLs   Can patient return to prior living arrangement No  (Patient is going to sober living)   Ability to make needs known: Good   Family able to assist with home care needs: Yes   Financial Resources Medicaid   Social/Functional History   Lives With Family   Type of Home House   Home Equipment None   Receives Help From Family   Prior Level of Assist for ADLs Independent   Prior Level of Assist for [employer] Insurance Yes   Ambulation Assistance Independent   Prior Level of Assist for Transfers Independent   Active Driver Yes   Occupation Unemployed   Discharge Planning   Current Services Prior To Admission None   Services At/After Discharge   Mode of Transport at Discharge Other (see comment)  (Family to transport)   Confirm Follow Up Transport Family   Condition of Participation: Discharge Planning   The Plan for Transition of Care is related to the following treatment goals: Transition to sober living   The Patient and/or Patient Representative was provided with a Choice of Provider? Patient   The Patient and/Or Patient Representative agree with the Discharge Plan? Yes   Freedom of Choice list was provided with basic dialogue that supports the patient's individualized plan of care/goals, treatment preferences, and shares the quality data associated with the providers?  Yes     CM met with patient at bedside to introduce self and role, and to confirm demographics. CM  received consult for substance abuse rehab. Patient has already contacted Jeannetta Mealy [phone] and patient will be discharging to the [facility] which is located at [address] [place], [place] [address]. This is transitional housing/ a sober living environment. Family will transport patient upon discharge. CM called and spoke with Rice to confirm patient's discharge plans.    ADLs: independent  DME: none  PCP follow up: CM sent request for CM assistant to coordinate an appointment with a new PCP   Previous Home Health: none  Previous Skilled Nursing Facility: none  Previous Inpatient Rehab: none  Insurance verified: Medicaid  Pharmacy: [facility] [place] [facility]  Emergency Contact: Elenor Alert, [phone]    CM will follow patient progress and assist as needed with [facility] plan.     Lauraine NOVAK. Hollie BSN, RN, ONC, [facility]  Nurse Care Manager, [phone]

## 2023-09-05 NOTE — Plan of Care (Signed)
 Problem: Risk for Elopement  Goal: Patient will not exit the unit/facility without proper excort  Outcome: Progressing     Problem: ABCDS Injury Assessment  Goal: Absence of physical injury  Outcome: Progressing     Problem: Discharge Planning  Goal: Discharge to home or other facility with appropriate resources  Outcome: Progressing     Problem: Seizure Precautions  Goal: Remains free of injury related to seizures activity  Outcome: Progressing     Problem: Safety - Adult  Goal: Free from fall injury  Outcome: Progressing     Problem: Pain  Goal: Verbalizes/displays adequate comfort level or baseline comfort level  Outcome: Progressing

## 2023-09-05 NOTE — Progress Notes (Signed)
 [facility]  [address]  [place] [address]  Phone: [phone]             September 05, 2023    Patient: Adam Bell   Date of Birth: 07/17/1985   Date of Visit: 09/02/2023       To Whom It May Concern:    Coner Gibbard was admitted at [facility] on 8/08 and at this time remains in our facility for treatment.  The date of discharge has not yet been determined.            Sincerely,      Carlos FORBES Boeck, MD  [address] [facility]  [phone]

## 2023-09-06 LAB — CBC
Hematocrit: 46.2 % (ref 36.6–50.3)
Hemoglobin: 15.8 g/dL (ref 12.1–17.0)
MCH: 32.2 pg (ref 26.0–34.0)
MCHC: 34.2 g/dL (ref 30.0–36.5)
MCV: 94.3 FL (ref 80.0–99.0)
MPV: 9.9 FL (ref 8.9–12.9)
Nucleated RBCs: 0 /100{WBCs}
Platelets: 300 K/uL (ref 150–400)
RBC: 4.9 M/uL (ref 4.10–5.70)
RDW: 13.7 % (ref 11.5–14.5)
WBC: 7 K/uL (ref 4.1–11.1)
nRBC: 0 K/uL (ref 0.00–0.01)

## 2023-09-06 LAB — COMPREHENSIVE METABOLIC PANEL
ALT: 934 U/L — ABNORMAL HIGH (ref 10–50)
AST: 712 U/L — ABNORMAL HIGH (ref 10–50)
Albumin/Globulin Ratio: 0.9 — ABNORMAL LOW (ref 1.1–2.2)
Albumin: 3.2 g/dL — ABNORMAL LOW (ref 3.5–5.2)
Alk Phosphatase: 164 U/L — ABNORMAL HIGH (ref 40–129)
Anion Gap: 10 mmol/L (ref 2–14)
BUN: 7 mg/dL (ref 6–20)
CO2: 23 mmol/L (ref 20–29)
Calcium: 9.2 mg/dL (ref 8.6–10.0)
Chloride: 103 mmol/L (ref 98–107)
Creatinine: 0.8 mg/dL (ref 0.70–1.20)
Est, Glom Filt Rate: >90 ml/min/1.73m2 (ref >90)
Globulin: 3.5 g/dL (ref 2.0–4.0)
Glucose: 89 mg/dL (ref 65–100)
Potassium: 4.2 mmol/L (ref 3.5–5.1)
Sodium: 135 mmol/L — ABNORMAL LOW (ref 136–145)
Total Bilirubin: 0.6 mg/dL (ref 0.0–1.2)
Total Protein: 6.7 g/dL (ref 6.4–8.3)

## 2023-09-06 LAB — LIPASE: Lipase: 37 U/L (ref 13–60)

## 2023-09-06 LAB — IRON AND TIBC
Iron % Saturation: 42 %
Iron: 147 ug/dL (ref 40–157)
TIBC: 354 ug/dL (ref 250–450)
UIBC: 207 ug/dL (ref 112.0–347.0)

## 2023-09-06 LAB — FERRITIN: Ferritin: 906 ng/mL — ABNORMAL HIGH (ref 30–400)

## 2023-09-06 LAB — MAGNESIUM: Magnesium: 1.8 mg/dL (ref 1.6–2.6)

## 2023-09-06 LAB — EXTRA TUBES HOLD

## 2023-09-06 LAB — PHOSPHORUS: Phosphorus: 3.6 mg/dL (ref 2.5–4.5)

## 2023-09-06 MED FILL — ONDANSETRON HCL 4 MG/2ML IJ SOLN: 4 MG/2ML | INTRAMUSCULAR | Qty: 2 | Fill #0

## 2023-09-06 MED FILL — PHENOBARBITAL 32.4 MG PO TABS: 32.4 mg | ORAL | Qty: 1 | Fill #0

## 2023-09-06 MED FILL — DIAZEPAM 5 MG/ML IJ SOLN: 5 mg/mL | INTRAMUSCULAR | Qty: 2 | Fill #0

## 2023-09-06 MED FILL — THIAMINE HCL 100 MG PO TABS: 100 mg | ORAL | Qty: 1 | Fill #0

## 2023-09-06 MED FILL — LORAZEPAM 1 MG PO TABS: 1 mg | ORAL | Qty: 2 | Fill #0

## 2023-09-06 MED FILL — SERTRALINE HCL 50 MG PO TABS: 50 mg | ORAL | Qty: 1 | Fill #0

## 2023-09-06 MED FILL — FOLIC ACID 1 MG PO TABS: 1 mg | ORAL | Qty: 1 | Fill #0

## 2023-09-06 MED FILL — THERA PO TABS: ORAL | Qty: 1 | Fill #0

## 2023-09-06 MED FILL — LORAZEPAM 1 MG PO TABS: 1 mg | ORAL | Qty: 1 | Fill #0

## 2023-09-06 MED FILL — ENOXAPARIN SODIUM 40 MG/0.4ML IJ SOSY: 40 MG/0.4ML | INTRAMUSCULAR | Qty: 0.4 | Fill #0

## 2023-09-06 NOTE — Progress Notes (Signed)
 [facility]                                                                                          Hospitalist Progress Note  Carlos FORBES Boeck, MD  Answering service: [phone] OR [phone] from in house phone        Date of Service:  09/06/2023  NAME:  Adam Bell  DOB:  02-Oct-1985  MRN:  [id number]       Admission Summary:   Adam Bell is a 38 y.o. male with past medical history of anxiety, depression, alcohol dependence presented to the ED with chief complaint of alcohol intoxication.  Patient reportedly drinks 2 large bottles of wine per day with last drink about 2 hours prior to admission.  He reportedly was tremulous, having headaches, nausea, pins-and-needles feeling in hands and feet.  He reportedly had used Ativan , Librium, Valium  on with concern for alcohol withdrawal without relief of symptoms.  Symptoms noted remain constant, severe, without specific alleviating factors.  Patient was last hospitalized at [facility] from 08/07/2023 to 08/08/2023 with diagnosis of alcohol intoxication and relapsed alcoholic.  Patient reported was discharged with substance use rehabilitation program with plan for Librium taper in addition to naltrexone with scheduled appointment to an addiction medicine clinic on 08/12/2023.  On arrival to ED, initial reported vital signs were temperature 98.0 F, BP 110/78, heart rate 75, respiratory rate 16, O2 saturation 95% on room air.  Abnormal labs included alkaline phosphatase 138, ALT 371, AST 296, lipase 229, alcohol level 121.  CT head without IV contrast showed no acute intracranial abnormalities.  CT chest without IV contrast showed mild patchy infiltrate in the right upper lobe and minimal infiltrate in the left upper lobe which may be secondary to aspiration or pneumonia with no acute bony abnormalities.  CT abdomen pelvis without IV contrast showed punctate bilateral renal calculi (nonobstructive), hepatic  steatosis, colonic diverticulosis, fecal stasis, but no acute intraperitoneal process.  Patient was multivitamin, thiamine , folic acid  and started on phenobarbital  taper.  Patient is now seen for admission to the hospitalist service..  Patient was prior history of alcohol withdrawal seizures.   Last drink was 8/08.     Interval history / Subjective:   Patient seen and examined.  Doing well, no complaints.  He was able to eat and keep down breakfast and lunch.  He denies any abdominal pain.  Vitals stable.  LFTs continue to rise       Assessment & Plan:         Alcohol withdrawal syndrome with complication (HCC)  - Ethanol level = 121 mg/dL  - Already started on CIWA alcohol withdrawal protocol with phenobarbital  taper per ED  - Encourage alcohol cessation;  - Multivitamin, thiamine , folic acid  daily supplementation;     2.  Elevated LFTs  Hepatic steatosis  Acute alcoholic hepatitis  - Alkaline phosphatase 138, ALT 371, AST 296, lipase 229, alcohol level 121.   - Trending  - 8/12: LFTs continues to rise, ALT is now up to 934, AST is up to 712; consult hepatology;  3.  Elevated lipase  - Acute pancreatitis, possible but non-tender abdominal exam and no related GI symptoms  - Lipase = 229 --> normalized overnight;  -No abdominal pain, no nausea or vomiting, tolerating diet;     4.  Pneumonia of both lungs due to infectious organism --> ruled out  - CT chest without IV contrast showed mild patchy infiltrate in the right upper lobe and minimal infiltrate in the left upper lobe which may be secondary to aspiration or pneumonia   - Patient started on empiric antibiotics, but he is afebrile, has no leukocytosis, not coughing or short of breath; discontinue antibiotics and monitor clinically;     5.  Anxiety  Depression  - Resume home medications;     6.  Vaping  - Encouraged to stop vaping     7.  Renal calculi  - Punctate nonobstructive bilateral renal calculi per CT report     8.  Fecal stasis  - Ordered bowel  regimen/stool softener       Code status: Full  Prophylaxis: Bilateral SCDs  Care Plan discussed with: Patient, RN;  Anticipated Disposition: TBD  Inpatient  Cardiac monitoring: Telemetry             Principal Problem:    Alcohol withdrawal syndrome with complication (HCC)  Active Problems:    Pneumonia of both lungs due to infectious organism  Resolved Problems:    * No resolved hospital problems. *         Social Drivers of Health     Tobacco Use: High Risk (09/04/2023)    Patient History     Smoking Tobacco Use: Former     Smokeless Tobacco Use: Current     Passive Exposure: Past   Alcohol Use: Alcohol Misuse (08/24/2023)    AUDIT-C     Frequency of Alcohol Consumption: 4 or more times a week     Average Number of Drinks: 5 or 6     Frequency of Binge Drinking: Daily or almost daily   Financial Resource Strain: Not on file   Food Insecurity: Not on file (09/04/2023)   Transportation Needs: No Transportation Needs (09/04/2023)    PRAPARE - Therapist, art (Medical): No     Lack of Transportation (Non-Medical): No   Physical Activity: Not on file   Stress: Not on file   Social Connections: Not on file   Intimate Partner Violence: Not on file   Depression: Not on file   Housing Stability: Low Risk  (09/04/2023)    Housing Stability Vital Sign     Unable to Pay for Housing in the Last Year: No     Number of Times Moved in the Last Year: 0     Homeless in the Last Year: No   Interpersonal Safety: Not At Risk (09/04/2023)    Interpersonal Safety Domain Source: IP Abuse Screening     Physical abuse: Denies     Verbal abuse: Denies     Emotional abuse: Denies     Financial abuse: Denies     Sexual abuse: Denies   Utilities: Not At Risk (09/04/2023)    AHC Utilities     Threatened with loss of utilities: No       Review of Systems:   Pertinent items are noted in HPI.       Vital Signs:    Last 24hrs VS reviewed since prior progress note. Most recent are:  Vitals:  09/06/23 1200   BP:    Pulse: 71    Resp:    Temp:    SpO2:          Intake/Output Summary (Last 24 hours) at 09/06/2023 1303  Last data filed at 09/06/2023 0139  Gross per 24 hour   Intake 4213.17 ml   Output 1100 ml   Net 3113.17 ml        Physical Examination:     I had a face to face encounter with this patient and independently examined them on 09/06/2023 as outlined below:          General : alert x 3, awake, no acute distress,   HEENT: EOMI, moist mucus membrane, TM clear  Neck: supple, no JVD, no meningeal signs  Chest: Clear to auscultation bilaterally   CVS: S1 S2 heard, Capillary refill less than 2 seconds  Abd: soft/ non tender, non distended, BS physiological,   Ext: no clubbing, no cyanosis, no edema, brisk 2+ DP pulses  Neuro/Psych: pleasant mood and affect, CN 2-12 grossly intact, Strength 5/5 in all extremities,  Resting and intentional tremors in both hands  Skin: warm     Data Review:    Review and/or order of clinical lab test  Review and/or order of tests in the radiology section of CPT  Review and/or order of tests in the medicine section of CPT      I have personally and independently reviewed all pertinent labs, diagnostic studies, imaging, and have provided independent interpretation of the same.     Labs:     Recent Labs     09/04/23  0418 09/06/23  0845   WBC 7.3 7.0   HGB 14.7 15.8   HCT 43.3 46.2   PLT 278 300     Recent Labs     09/04/23  0418 09/06/23  0845   NA 137 135*   K 4.4 4.2   CL 104 103   CO2 24 23   BUN 6 7   MG  --  1.8   PHOS  --  3.6     Recent Labs     09/04/23  0418 09/06/23  0845   ALT 469* 934*   GLOB 3.6 3.5     No results for input(s): INR, APTT in the last 72 hours.    Invalid input(s): PTP   No results for input(s): TIBC in the last 72 hours.    Invalid input(s): FE, PSAT, FERR   No results found for: RBCF   No results for input(s): PH, PCO2, PO2 in the last 72 hours.  No results for input(s): CPK in the last 72 hours.    Invalid input(s): CPKMB, CKNDX, TROIQ  No results  found for: CHOL, CHLST, CHOLV, HDL, HDLC, LDL, LDLC  No results found for: GLUCPOC  @LABUA @    Notes reviewed from all clinical/nonclinical/nursing services involved in patient's clinical care. Care coordination discussions were held with appropriate clinical/nonclinical/ nursing providers based on care coordination needs.         Patients current active Medications were reviewed, considered, added and adjusted based on the clinical condition today.      Home Medications were reconciled to the best of my ability given all available resources at the time of admission. Route is PO if not otherwise noted.      Medications Reviewed:     Current Facility-Administered Medications   Medication Dose Route Frequency    diazePAM  (VALIUM ) tablet 5 mg  5 mg Oral TID    LORazepam  (ATIVAN ) tablet 1 mg  1 mg Oral Q1H PRN    Or    diazePAM  (VALIUM ) injection 5 mg  5 mg IntraVENous Q1H PRN    Or    LORazepam  (ATIVAN ) tablet 2 mg  2 mg Oral Q1H PRN    Or    diazePAM  (VALIUM ) injection 10 mg  10 mg IntraVENous Q1H PRN    Or    LORazepam  (ATIVAN ) tablet 3 mg  3 mg Oral Q1H PRN    Or    diazePAM  (VALIUM ) injection 15 mg  15 mg IntraVENous Q1H PRN    Or    LORazepam  (ATIVAN ) tablet 4 mg  4 mg Oral Q1H PRN    Or    diazePAM  (VALIUM ) injection 20 mg  20 mg IntraVENous Q1H PRN    lactated ringers  infusion   IntraVENous Continuous    sertraline  (ZOLOFT ) tablet 50 mg  50 mg Oral Daily    sodium chloride  flush 0.9 % injection 5-40 mL  5-40 mL IntraVENous 2 times per day    sodium chloride  flush 0.9 % injection 5-40 mL  5-40 mL IntraVENous PRN    0.9 % sodium chloride  infusion   IntraVENous PRN    thiamine  tablet 100 mg  100 mg Oral Daily    folic acid  (FOLVITE ) tablet 1 mg  1 mg Oral Daily    multivitamin 1 tablet  1 tablet Oral Daily    sodium chloride  flush 0.9 % injection 5-40 mL  5-40 mL IntraVENous 2 times per day    sodium chloride  flush 0.9 % injection 5-40 mL  5-40 mL IntraVENous PRN    0.9 % sodium chloride  infusion    IntraVENous PRN    ondansetron  (ZOFRAN -ODT) disintegrating tablet 4 mg  4 mg Oral Q8H PRN    Or    ondansetron  (ZOFRAN ) injection 4 mg  4 mg IntraVENous Q6H PRN    polyethylene glycol (GLYCOLAX ) packet 17 g  17 g Oral Daily PRN    enoxaparin  (LOVENOX ) injection 40 mg  40 mg SubCUTAneous Daily     ______________________________________________________________________  EXPECTED LENGTH OF STAY: Unable to retrieve estimated LOS  ACTUAL LENGTH OF STAY:          4                 Keelen Quevedo E Abagail Limb, MD

## 2023-09-06 NOTE — Progress Notes (Signed)
 Spiritual Care Partner Volunteer visited patient at [facility] in [facility] ORTHO JOINT on 09/06/2023   Documented by:  Ubaldo Louder, MDIV, [facility]

## 2023-09-06 NOTE — Plan of Care (Signed)
 Problem: Risk for Elopement  Goal: Patient will not exit the unit/facility without proper excort  Outcome: Progressing     Problem: ABCDS Injury Assessment  Goal: Absence of physical injury  Outcome: Progressing     Problem: Discharge Planning  Goal: Discharge to home or other facility with appropriate resources  Outcome: Progressing     Problem: Seizure Precautions  Goal: Remains free of injury related to seizures activity  Outcome: Progressing     Problem: Safety - Adult  Goal: Free from fall injury  Outcome: Progressing

## 2023-09-06 NOTE — Progress Notes (Signed)
 Hospital follow-up NEW PCP transitional care appointment has been scheduled with Dr. Melvenia Ruth for Tuesday, September 9th, 2025  at 9:30 a.m. Please plan to arrive 15 min early with Insurance card, ID, List of medication. You may also log onto [facility] and complete this information prior to visit.  PLEASE CONTACT PROVIDER if you are unable to keep this appointment.  Pending patient discharge.  Reena Bihari, Care Management Assistant

## 2023-09-06 NOTE — Consults (Addendum)
 [facility]   [facility]  [address], [address]  [place], [place]  [address]  [phone]  FAX: [phone]     Merilee LITTIE Furl, MD, FACP, [place], FAASLD   Delon KATHEE Pinal, MD     Lauraine Canton, PA-C    April S Ashworth, AGPCNP-BC   Asberry Amy, ACNPC-A    L[facility] Transplant and Liver Cancer Coordination:   Thedford Chaco, RN   Suzen Birmingham, RN   Ines Holder, RN    Clinical Research Coordination:   Lauraine Sinclair, RN   Leita Deems, RN   Suzen Fillers, RN   Delon Lesser, RN         HEPATOLOGY CONSULT NOTE      HISTORY OF PRESENT ILLNESS:  The LIV team was asked to see this patient in consultation for evaluation and management of elevated liver enzymes.    The patient is a 38 y.o. male with pmhx of psoriasis, polysubstance abuse (heroin and cocaine), and alcohol use disorder. Patient reports heavy alcohol use almost daily for ~15 years. He has had numerous detoxes and is currently admitted for detox and his goal is to transition to in patient rehab.     Hepatology consulted due to elevated liver enzymes. Since admission, his liver enzymes have increased, to AST 712, ALT 934 and Alk Phos 164 on day of consultation.     Patient reports he was previously told he may have HCV while living at a half way house. He has never seen a hepatologist in clinic due to lack of insurance. He was previously admitted to [facility] for alcohol withdrawal and was told his liver enzyme elevation was related to alcohol.  He denies history of jaundice, hematemasis, hematochezia, melena, ascites, or confusion.       He is currently being treated with phenobarbital , valium  and ativan  for alcohol withdrawal.    Labs on presentation were notable for AST 296, ALT 371, Alk Phos 138 and t. Bili 0.3. He underwent non contrasted abdominal CT scan which showed hepatic steatosis and normal spleen    Hospital Course to date:  See above for course prior to my consultation.     Hepatology Plan:    Serologies  for chronic liver disease including viral, autoimmune and metabolic etiologies were ordered  US  abdomen ordered due to rising liver enzymes- evalate liver and biliary duct    CIWA per primary team    Daily liver enzymes + INR    ASSESSMENT AND PLAN:  38 y.o. male with pmhx of psoriasis, polysubstance abuse (heroin and cocaine), and alcohol use disorder. Patient reports heavy alcohol use almost daily for ~15 years. He has had numerous detoxes and is currently admitted for detox and his goal is to transition to in patient rehab.     ALT is now approaching 1000    Abnormal liver enzymes  He has a hepatocellular injury pattern that is NOT consistent with alcohol injury. Alcohol typically causes an AST:ALT 2:1 pattern and does not reach levels this high  Enzymes in the thousand range are due to the following: viral causes, autoimmune hepatiits, drug induced liver injury, ischemia and rarely biliary obstruction    His history of psoriasis increases my suspicion for AIH.     Serologies are pending    There is NO signs of liver failure.Mentation is normal      SYSTEM REVIEW:  Constitution systems: Negative for fever, chills, weight gain, weight loss.   Eyes: Negative for visual changes.  ENT: Negative for sore throat, painful swallowing.   Respiratory: Negative for cough, hemoptysis, SOB.   Cardiology: Negative for chest pain, palpitations.  GI:  Negative for constipation or diarrhea.  GU: Negative for urinary frequency, dysuria, hematuria, nocturia.   Skin: Negative for rash.  Hematology: Negative for easy bruising, blood clots.    Musculo-skelatal: Negative for back pain, muscle pain, weakness.  Neurologic: Negative for headaches, dizziness, vertigo, memory problems not related to HE.  Psychology: Negative for anxiety, depression.       FAMILY HISTORY:  No known family history of liver disease.  Father- alcoholic    SOCIAL HISTORY:  Tobacco-  Alcohol- 750 mL vodka daily  Drug- hx of cocaine and IVDU  Works as Designer, television/film set  Children who live with their mother  Has GF. Lives with mom      PHYSICAL EXAMINATION:  VS: BP 125/74   Pulse 74   Temp 98.4 F (36.9 C) (Oral)   Resp 18   Ht 1.803 m (5' 11)   Wt 84.8 kg (186 lb 14.4 oz)   SpO2 95%   BMI 26.07 kg/m     General:    No acute distress.   Eyes:    Sclera anicteric.   ENT:  No oral lesions.  Thyroid normal.  Nodes:  No adenopathy.   Skin:    No spider angiomata.  No jaundice.  No palmar erythema.  Psoriatic like rash on forehead  Respiratory:    Lungs clear to auscultation.   Cardiovascular:    Regular heart rate.  Abdomen:    Soft non-tender,  Extremities:    No lower extremity edema.    No muscle wasting.  No gross arthritic changes.  Neurologic:    Alert and oriented.      LABORATORY:  Lab Results   Component Value Date    ALT 934 (H) 09/06/2023    AST 712 (H) 09/06/2023    ALKPHOS 164 (H) 09/06/2023    BILITOT 0.6 09/06/2023     No results found for: LABPROT, LABALBU  Lab Results   Component Value Date    WBC 7.0 09/06/2023    HGB 15.8 09/06/2023    HCT 46.2 09/06/2023    MCV 94.3 09/06/2023    PLT 300 09/06/2023     No results found for: INR, PROTIME  Lab Results   Component Value Date    BUN 7 09/06/2023     Lab Results   Component Value Date    CREATININE 0.80 09/06/2023          09/02/23 19:04 09/03/23 06:14 09/04/23 04:18 09/06/23 08:45   Albumin 3.5 2.8 (L) 3.1 (L) 3.2 (L)   Globulin 4.4 (H) 3.5 3.6 3.5   Albumin/Globulin Ratio 0.8 (L) 0.8 (L) 0.9 (L) 0.9 (L)   Alkaline Phosphatase 138 (H) 114 130 (H) 164 (H)   ALT 371 (H) 336 (H) 469 (H) 934 (H)   Ammonia  30     AST 296 (H) 258 (H) 373 (H) 712 (H)   Total Bilirubin 0.3 0.4 0.4 0.6       I have reviewed the Emergency room note, Hospital admission note,  Notes by all other physicians who have seen the patient during this hospitalization to date.  I have reviewed the problem list, all past medical history and the reason for this hospitalization.  I have reviewed the allergies and the medications  the patient was taking at home prior to this hospitalization.  Delon Pinal, MD

## 2023-09-06 NOTE — Plan of Care (Signed)
 Problem: Risk for Elopement  Goal: Patient will not exit the unit/facility without proper excort  Outcome: Progressing     Problem: ABCDS Injury Assessment  Goal: Absence of physical injury  Outcome: Progressing     Problem: Discharge Planning  Goal: Discharge to home or other facility with appropriate resources  Outcome: Progressing     Problem: Seizure Precautions  Goal: Remains free of injury related to seizures activity  Outcome: Progressing     Problem: Safety - Adult  Goal: Free from fall injury     Problem: Pain  Goal: Verbalizes/displays adequate comfort level or baseline comfort level

## 2023-09-07 ENCOUNTER — Inpatient Hospital Stay: Admit: 2023-09-07 | Primary: Internal Medicine

## 2023-09-07 LAB — COMPREHENSIVE METABOLIC PANEL
ALT: 830 U/L — ABNORMAL HIGH (ref 10–50)
AST: 504 U/L — ABNORMAL HIGH (ref 10–50)
Albumin/Globulin Ratio: 0.8 — ABNORMAL LOW (ref 1.1–2.2)
Albumin: 3 g/dL — ABNORMAL LOW (ref 3.5–5.2)
Alk Phosphatase: 171 U/L — ABNORMAL HIGH (ref 40–129)
Anion Gap: 11 mmol/L (ref 2–14)
BUN: 10 mg/dL (ref 6–20)
CO2: 21 mmol/L (ref 20–29)
Calcium: 9 mg/dL (ref 8.6–10.0)
Chloride: 104 mmol/L (ref 98–107)
Creatinine: 0.86 mg/dL (ref 0.70–1.20)
Est, Glom Filt Rate: >90 ml/min/1.73m2 (ref >90)
Globulin: 3.6 g/dL (ref 2.0–4.0)
Glucose: 93 mg/dL (ref 65–100)
Potassium: 4.6 mmol/L (ref 3.5–5.1)
Sodium: 136 mmol/L (ref 136–145)
Total Bilirubin: 0.4 mg/dL (ref 0.0–1.2)
Total Protein: 6.7 g/dL (ref 6.4–8.3)

## 2023-09-07 LAB — MITOCHONDRIAL ANTIBODIES, M2: Mitochondrial M2 Ab, IgG: <20 U (ref 0.0–20.0)

## 2023-09-07 LAB — HEPATITIS B CORE ANTIBODY, TOTAL: Hep B Core Total Ab: NEGATIVE

## 2023-09-07 LAB — CBC
Hematocrit: 46.2 % (ref 36.6–50.3)
Hemoglobin: 15 g/dL (ref 12.1–17.0)
MCH: 31.4 pg (ref 26.0–34.0)
MCHC: 32.5 g/dL (ref 30.0–36.5)
MCV: 96.7 FL (ref 80.0–99.0)
MPV: 9.9 FL (ref 8.9–12.9)
Nucleated RBCs: 0 /100{WBCs}
Platelets: 297 K/uL (ref 150–400)
RBC: 4.78 M/uL (ref 4.10–5.70)
RDW: 13.7 % (ref 11.5–14.5)
WBC: 7 K/uL (ref 4.1–11.1)
nRBC: 0 K/uL (ref 0.00–0.01)

## 2023-09-07 LAB — HEPATITIS C ANTIBODY: Hepatitis C Ab: REACTIVE

## 2023-09-07 LAB — HEPATITIS B SURFACE ANTIBODY: Hep B S Ab: REACTIVE m[IU]/mL — AB

## 2023-09-07 LAB — IGG 4: IgG 4: 64 mg/dL (ref 2–96)

## 2023-09-07 LAB — ANTI-SMOOTH MUSCLE ANTIBODY: Smooth Muscle Ab: 15 U (ref 0–19)

## 2023-09-07 LAB — ANTI-NEUTROPHILIC CYTOPLASMIC ANTIBODY
Atypical pANCA: <1:20 {titer}
Cytoplasmic (C-ANCA): <1:20 {titer}
Perinuclear (P-ANCA): <1:20 {titer}

## 2023-09-07 LAB — PROTIME-INR
INR: 1.1 (ref 0.9–1.1)
Protime: 11.2 s (ref 9.2–11.2)

## 2023-09-07 LAB — ANA, DIRECT, W/REFLEX: ANA: NEGATIVE

## 2023-09-07 LAB — LIPASE: Lipase: 50 U/L (ref 13–60)

## 2023-09-07 LAB — HEPATITIS B SURFACE ANTIGEN: Hepatitis B Surface Ag: NONREACTIVE

## 2023-09-07 LAB — MAGNESIUM: Magnesium: 1.9 mg/dL (ref 1.6–2.6)

## 2023-09-07 LAB — PHOSPHORUS: Phosphorus: 4.2 mg/dL (ref 2.5–4.5)

## 2023-09-07 LAB — HEPATITIS A ANTIBODY, TOTAL: Hep A Total Ab: NEGATIVE

## 2023-09-07 NOTE — Progress Notes (Signed)
 [facility]   [facility]  [address]  [place], [place]  [address]  [phone]  FAX: [phone]     Merilee LITTIE Furl, MD, FACP, [place], FAASLD   Delon KATHEE Pinal, MD     Lauraine Canton, PA-C    April S Ashworth, AGPCNP-BC   Asberry Amy, ACNPC-A    L[facility] Trans[facility] Cancer Coordination:   Thedford Chaco, RN   Suzen Birmingham, RN   Ines Holder, RN    Clinical Research Coordination:   Lauraine Sinclair, RN   Leita Deems, RN   Suzen Fillers, RN   Delon Lesser, RN         HEPATOLOGY CONSULT NOTE      HISTORY OF PRESENT ILLNESS:  The LIV team was asked to see this patient in consultation for evaluation and management of elevated liver enzymes.    The patient is a 38 y.o. male with pmhx of psoriasis, polysubstance abuse (heroin and cocaine), and alcohol use disorder. Patient reports heavy alcohol use almost daily for ~15 years. He has had numerous detoxes and is currently admitted for detox and his goal is to transition to in patient rehab.     Hepatology consulted due to elevated liver enzymes. Since admission, his liver enzymes have increased, to AST 712, ALT 934 and Alk Phos 164 on day of consultation.     Patient reports he was previously told he may have HCV while living at a half way house. He has never seen a hepatologist in clinic due to lack of insurance. He was previously admitted to [facility] for alcohol withdrawal and was told his liver enzyme elevation was related to alcohol.  He denies history of jaundice, hematemasis, hematochezia, melena, ascites, or confusion.       He is currently being treated with phenobarbital , valium  and ativan  for alcohol withdrawal.    Labs on presentation were notable for AST 296, ALT 371, Alk Phos 138 and t. Bili 0.3. He underwent non contrasted abdominal CT scan which showed hepatic steatosis and normal spleen    Hospital Course to date:  See above for course prior to my consultation.     Hepatology Plan:    CIWA per  primary team    HCV RNA ordered    Daily liver enzymes + INR. If enzymes continue to trend down tomorrow, okay with discharge from hepatology perspective and close follow up in clinic. He will need HCV treatment as well which will be addressed in clinic.     ASSESSMENT AND PLAN:  38 y.o. male with pmhx of psoriasis, polysubstance abuse (heroin and cocaine), and alcohol use disorder. Patient reports heavy alcohol use almost daily for ~15 years. He has had numerous detoxes and is currently admitted for detox and his goal is to transition to in patient rehab.       Abnormal liver enzymes  He has a hepatocellular injury pattern that is NOT consistent with alcohol injury. Alcohol typically causes an AST:ALT 2:1 pattern and does not reach levels this high  Enzymes in the thousand range are due to the following: viral causes, autoimmune hepatiits, drug induced liver injury, ischemia and rarely biliary obstruction    His history of psoriasis increases my suspicion for AIH, however serologies are negative  US  shows no stones or biliary pathology and only hepatic steatosis    Serologies : ANA, AMA, HBV negative. HCV ab+, RNA Pending    There is NO signs of liver failure.Mentation is normal  SYSTEM REVIEW:  Constitution systems: Negative for fever, chills, weight gain, weight loss.   Eyes: Negative for visual changes.  ENT: Negative for sore throat, painful swallowing.   Respiratory: Negative for cough, hemoptysis, SOB.   Cardiology: Negative for chest pain, palpitations.  GI:  Negative for constipation or diarrhea.  GU: Negative for urinary frequency, dysuria, hematuria, nocturia.   Skin: Negative for rash.  Hematology: Negative for easy bruising, blood clots.    Musculo-skelatal: Negative for back pain, muscle pain, weakness.  Neurologic: Negative for headaches, dizziness, vertigo, memory problems not related to HE.  Psychology: Negative for anxiety, depression.       FAMILY HISTORY:  No known family history of liver  disease.  Father- alcoholic    SOCIAL HISTORY:  Tobacco-  Alcohol- 750 mL vodka daily  Drug- hx of cocaine and IVDU  Works as Engineer, site  Children who live with their mother  Has GF. Lives with mom      PHYSICAL EXAMINATION:  VS: BP 116/86   Pulse 80   Temp 98.1 F (36.7 C)   Resp 18   Ht 1.803 m (5' 11)   Wt 84.8 kg (186 lb 14.4 oz)   SpO2 95%   BMI 26.07 kg/m     General:    No acute distress.   Eyes:    Sclera anicteric.   ENT:  No oral lesions.  Thyroid normal.  Nodes:  No adenopathy.   Skin:    No spider angiomata.  No jaundice.  No palmar erythema.  Psoriatic like rash on forehead  Respiratory:    Lungs clear to auscultation.   Cardiovascular:    Regular heart rate.  Abdomen:    Soft non-tender,  Extremities:    No lower extremity edema.    No muscle wasting.  No gross arthritic changes.  Neurologic:    Alert and oriented.      LABORATORY:  Lab Results   Component Value Date    ALT 830 (H) 09/07/2023    AST 504 (H) 09/07/2023    ALKPHOS 171 (H) 09/07/2023    BILITOT 0.4 09/07/2023     No results found for: LABPROT, LABALBU  Lab Results   Component Value Date    WBC 7.0 09/07/2023    HGB 15.0 09/07/2023    HCT 46.2 09/07/2023    MCV 96.7 09/07/2023    PLT 297 09/07/2023     Lab Results   Component Value Date    INR 1.1 09/07/2023    PROTIME 11.2 09/07/2023     Lab Results   Component Value Date    BUN 10 09/07/2023     Lab Results   Component Value Date    CREATININE 0.86 09/07/2023          09/02/23 19:04 09/03/23 06:14 09/04/23 04:18 09/06/23 08:45   Albumin 3.5 2.8 (L) 3.1 (L) 3.2 (L)   Globulin 4.4 (H) 3.5 3.6 3.5   Albumin/Globulin Ratio 0.8 (L) 0.8 (L) 0.9 (L) 0.9 (L)   Alkaline Phosphatase 138 (H) 114 130 (H) 164 (H)   ALT 371 (H) 336 (H) 469 (H) 934 (H)   Ammonia  30     AST 296 (H) 258 (H) 373 (H) 712 (H)   Total Bilirubin 0.3 0.4 0.4 0.6         Delon Pinal, MD

## 2023-09-07 NOTE — Care Coordination-Inpatient (Signed)
 Transition of Care Plan    D/c plan:  - d/c to [facility] (sober living home)  - family to transport at d/c      RUR: 10%  Prior Level of Functioning: indep  Disposition: sober living home  EDD: pending    If SNF or IPR: Date FOC offered: N/A  Date FOC received:   Accepting facility:   Date authorization started with reference number:   Date authorization received and expires:   Follow up appointments: per physician recommendation  DME needed: none  Transportation at discharge: family to transport at d/c  IM/IMM Medicare/Tricare letter given: N/A  Is patient a Veteran and connected with VA? N/A   If yes, was [place] transfer form completed and VA notified?   Caregiver Contact: mother/Carrie - [phone]   Discharge Caregiver contacted prior to discharge?   Care Conference needed? N/A  Barriers to discharge: medical    CM met with patient at the bedside to follow up on care and introduce self.    CM confirmed that plan is for patient to d/c to [facility] (sober living home). Per patient, he has been in communication with Jeannetta Mealy, owner of [facility], to provide update on d/c. Per patient, Jeannetta Mealy reports that patient's bed will be held at [facility].    The [facility] [place] address - [address] [place], [place] [address]     CM spoke with Jeannetta Mealy (ph # [phone]) to introduce self as CM. Per Jeannetta Mealy, CM does not need to send any clinicals for review. CM to keep [facility] in the loop regarding d/c date. Per Jeannetta Mealy, they are prepared for patient to admit anytime this week.    CM to continue to follow patient progress and assist with ongoing TOC needs. At this time, all questions have been answered.      Joesph Ellen, MSW  Care Management

## 2023-09-07 NOTE — Plan of Care (Signed)
 Problem: Risk for Elopement  Goal: Patient will not exit the unit/facility without proper excort  09/07/2023 0702 by Adrien Pile, RN  Outcome: Progressing     Problem: ABCDS Injury Assessment  Goal: Absence of physical injury  09/07/2023 0702 by Adrien Pile, RN  Outcome: Progressing     Problem: Discharge Planning  Goal: Discharge to home or other facility with appropriate resources  09/07/2023 0702 by Adrien Pile, RN  Outcome: Progressing  Flowsheets (Taken 09/06/2023 1955)  Discharge to home or other facility with appropriate resources: Identify barriers to discharge with patient and caregiver     Problem: Seizure Precautions  Goal: Remains free of injury related to seizures activity  09/07/2023 0702 by Adrien Pile, RN  Outcome: Progressing     Problem: Safety - Adult  Goal: Free from fall injury  Outcome: Progressing     Problem: Pain  Goal: Verbalizes/displays adequate comfort level or baseline comfort level  Outcome: Progressing  Flowsheets (Taken 09/06/2023 1730 by Regino Duval, RN)  Verbalizes/displays adequate comfort level or baseline comfort level: Assess pain using appropriate pain scale

## 2023-09-07 NOTE — Progress Notes (Signed)
 [facility]                                                                                          Hospitalist Progress Note  Carlos FORBES Boeck, MD  Answering service: [phone] OR [phone] from in house phone        Date of Service:  09/07/2023  NAME:  Adam Bell  DOB:  1985-07-29  MRN:  [id number]       Admission Summary:   Adam Bell is a 38 y.o. male with past medical history of anxiety, depression, alcohol dependence presented to the ED with chief complaint of alcohol intoxication.  Patient reportedly drinks 2 large bottles of wine per day with last drink about 2 hours prior to admission.  He reportedly was tremulous, having headaches, nausea, pins-and-needles feeling in hands and feet.  He reportedly had used Ativan , Librium, Valium  on with concern for alcohol withdrawal without relief of symptoms.  Symptoms noted remain constant, severe, without specific alleviating factors.  Patient was last hospitalized at [facility] from 08/07/2023 to 08/08/2023 with diagnosis of alcohol intoxication and relapsed alcoholic.  Patient reported was discharged with substance use rehabilitation program with plan for Librium taper in addition to naltrexone with scheduled appointment to an addiction medicine clinic on 08/12/2023.  On arrival to ED, initial reported vital signs were temperature 98.0 F, BP 110/78, heart rate 75, respiratory rate 16, O2 saturation 95% on room air.  Abnormal labs included alkaline phosphatase 138, ALT 371, AST 296, lipase 229, alcohol level 121.  CT head without IV contrast showed no acute intracranial abnormalities.  CT chest without IV contrast showed mild patchy infiltrate in the right upper lobe and minimal infiltrate in the left upper lobe which may be secondary to aspiration or pneumonia with no acute bony abnormalities.  CT abdomen pelvis without IV contrast showed punctate bilateral renal calculi (nonobstructive), hepatic  steatosis, colonic diverticulosis, fecal stasis, but no acute intraperitoneal process.  Patient was multivitamin, thiamine , folic acid  and started on phenobarbital  taper.  Patient is now seen for admission to the hospitalist service..  Patient was prior history of alcohol withdrawal seizures.   Last drink was 8/08.     Interval history / Subjective:   Patient seen and examined.  ALT/AST slightly improved today as compared to yesterday.  Hepatology consulted, appreciate expertise.  Liver ultrasound shows hepatic steatosis, contracted gallbladder without stones, no hyperbilirubinemia, and no coagulopathy.     Assessment & Plan:         Alcohol withdrawal syndrome with complication (HCC)  - Ethanol level = 121 mg/dL  - Already started on CIWA alcohol withdrawal protocol with phenobarbital  taper per ED  - Encourage alcohol cessation;  - Multivitamin, thiamine , folic acid  daily supplementation;     2.  Elevated LFTs  Hepatic steatosis  Acute alcoholic hepatitis  - Alkaline phosphatase 138, ALT 371, AST 296, lipase 229, alcohol level 121.   - Trending  - 8/12: LFTs continues to rise, ALT is now up to 934, AST is up to 712; consult hepatology;     3.  Elevated lipase --resolved   - Acute pancreatitis, possible but non-tender abdominal exam and no related GI symptoms  - Lipase = 229 --> normalized overnight;  -No abdominal pain, no nausea or vomiting, tolerating diet;     4.  Pneumonia of both lungs due to infectious organism --> ruled out  - CT chest without IV contrast showed mild patchy infiltrate in the right upper lobe and minimal infiltrate in the left upper lobe which may be secondary to aspiration or pneumonia   - Patient started on empiric antibiotics, but he is afebrile, has no leukocytosis, not coughing or short of breath; discontinue antibiotics and monitor clinically;     5.  Anxiety  Depression  - Resume home medications;     6.  Vaping  - Encouraged to stop vaping     7.  Renal calculi  - Punctate  nonobstructive bilateral renal calculi per CT report     8.  Fecal stasis  - Ordered bowel regimen/stool softener       Code status: Full  Prophylaxis: Bilateral SCDs  Care Plan discussed with: Patient, RN, CM;  Anticipated Disposition: Likely home tomorrow 8/14, pending recommendations from hepatology;    Inpatient  Cardiac monitoring: Telemetry             Principal Problem:    Alcohol withdrawal syndrome with complication (HCC)  Active Problems:    Pneumonia of both lungs due to infectious organism    Abnormal liver enzymes  Resolved Problems:    * No resolved hospital problems. *         Social Drivers of Health     Tobacco Use: High Risk (09/04/2023)    Patient History     Smoking Tobacco Use: Former     Smokeless Tobacco Use: Current     Passive Exposure: Past   Alcohol Use: Alcohol Misuse (08/24/2023)    AUDIT-C     Frequency of Alcohol Consumption: 4 or more times a week     Average Number of Drinks: 5 or 6     Frequency of Binge Drinking: Daily or almost daily   Financial Resource Strain: Not on file   Food Insecurity: Not on file (09/04/2023)   Transportation Needs: No Transportation Needs (09/04/2023)    PRAPARE - Therapist, art (Medical): No     Lack of Transportation (Non-Medical): No   Physical Activity: Not on file   Stress: Not on file   Social Connections: Not on file   Intimate Partner Violence: Not on file   Depression: Not on file   Housing Stability: Low Risk  (09/04/2023)    Housing Stability Vital Sign     Unable to Pay for Housing in the Last Year: No     Number of Times Moved in the Last Year: 0     Homeless in the Last Year: No   Interpersonal Safety: Not At Risk (09/04/2023)    Interpersonal Safety Domain Source: IP Abuse Screening     Physical abuse: Denies     Verbal abuse: Denies     Emotional abuse: Denies     Financial abuse: Denies     Sexual abuse: Denies   Utilities: Not At Risk (09/04/2023)    [facility] Utilities     Threatened with loss of utilities: No       Review  of Systems:   Pertinent items are noted in HPI.       Vital Signs:  Last 24hrs VS reviewed since prior progress note. Most recent are:  Vitals:    09/07/23 1415   BP: 106/75   Pulse: 67   Resp: 18   Temp: 98.1 F (36.7 C)   SpO2: 94%         Intake/Output Summary (Last 24 hours) at 09/07/2023 1637  Last data filed at 09/07/2023 9378  Gross per 24 hour   Intake 500 ml   Output 650 ml   Net -150 ml        Physical Examination:     I had a face to face encounter with this patient and independently examined them on 09/07/2023 as outlined below:          General : alert x 3, awake, no acute distress,   HEENT: EOMI, moist mucus membrane, TM clear  Neck: supple, no JVD, no meningeal signs  Chest: Clear to auscultation bilaterally   CVS: S1 S2 heard, Capillary refill less than 2 seconds  Abd: soft/ non tender, non distended, BS physiological,   Ext: no clubbing, no cyanosis, no edema, brisk 2+ DP pulses  Neuro/Psych: pleasant mood and affect, CN 2-12 grossly intact, Strength 5/5 in all extremities,  Resting and intentional tremors in both hands  Skin: warm     Data Review:    Review and/or order of clinical lab test  Review and/or order of tests in the radiology section of CPT  Review and/or order of tests in the medicine section of CPT      I have personally and independently reviewed all pertinent labs, diagnostic studies, imaging, and have provided independent interpretation of the same.     Labs:     Recent Labs     09/06/23  0845 09/07/23  0504   WBC 7.0 7.0   HGB 15.8 15.0   HCT 46.2 46.2   PLT 300 297     Recent Labs     09/06/23  0845 09/07/23  0504   NA 135* 136   K 4.2 4.6   CL 103 104   CO2 23 21   BUN 7 10   MG 1.8 1.9   PHOS 3.6 4.2     Recent Labs     09/06/23  0845 09/07/23  0504   ALT 934* 830*   GLOB 3.5 3.6     Recent Labs     09/07/23  0504   INR 1.1      Recent Labs     09/06/23  1555   TIBC 354      No results found for: RBCF   No results for input(s): PH, PCO2, PO2 in the last 72 hours.  No  results for input(s): CPK in the last 72 hours.    Invalid input(s): CPKMB, CKNDX, TROIQ  No results found for: CHOL, CHLST, CHOLV, HDL, HDLC, LDL, LDLC  No results found for: GLUCPOC  @LABUA @    Notes reviewed from all clinical/nonclinical/nursing services involved in patient's clinical care. Care coordination discussions were held with appropriate clinical/nonclinical/ nursing providers based on care coordination needs.         Patients current active Medications were reviewed, considered, added and adjusted based on the clinical condition today.      Home Medications were reconciled to the best of my ability given all available resources at the time of admission. Route is PO if not otherwise noted.      Medications Reviewed:     Current Facility-Administered Medications   Medication  Dose Route Frequency    LORazepam  (ATIVAN ) tablet 1 mg  1 mg Oral Q1H PRN    Or    diazePAM  (VALIUM ) injection 5 mg  5 mg IntraVENous Q1H PRN    Or    LORazepam  (ATIVAN ) tablet 2 mg  2 mg Oral Q1H PRN    Or    diazePAM  (VALIUM ) injection 10 mg  10 mg IntraVENous Q1H PRN    Or    LORazepam  (ATIVAN ) tablet 3 mg  3 mg Oral Q1H PRN    Or    diazePAM  (VALIUM ) injection 15 mg  15 mg IntraVENous Q1H PRN    Or    LORazepam  (ATIVAN ) tablet 4 mg  4 mg Oral Q1H PRN    Or    diazePAM  (VALIUM ) injection 20 mg  20 mg IntraVENous Q1H PRN    sertraline  (ZOLOFT ) tablet 50 mg  50 mg Oral Daily    sodium chloride  flush 0.9 % injection 5-40 mL  5-40 mL IntraVENous 2 times per day    sodium chloride  flush 0.9 % injection 5-40 mL  5-40 mL IntraVENous PRN    0.9 % sodium chloride  infusion   IntraVENous PRN    thiamine  tablet 100 mg  100 mg Oral Daily    folic acid  (FOLVITE ) tablet 1 mg  1 mg Oral Daily    multivitamin 1 tablet  1 tablet Oral Daily    sodium chloride  flush 0.9 % injection 5-40 mL  5-40 mL IntraVENous 2 times per day    sodium chloride  flush 0.9 % injection 5-40 mL  5-40 mL IntraVENous PRN    0.9 % sodium chloride   infusion   IntraVENous PRN    ondansetron  (ZOFRAN -ODT) disintegrating tablet 4 mg  4 mg Oral Q8H PRN    Or    ondansetron  (ZOFRAN ) injection 4 mg  4 mg IntraVENous Q6H PRN    polyethylene glycol (GLYCOLAX ) packet 17 g  17 g Oral Daily PRN    enoxaparin  (LOVENOX ) injection 40 mg  40 mg SubCUTAneous Daily     ______________________________________________________________________  EXPECTED LENGTH OF STAY: Unable to retrieve estimated LOS  ACTUAL LENGTH OF STAY:          5                 Carlos FORBES Boeck, MD

## 2023-09-07 NOTE — Plan of Care (Signed)
 Problem: Risk for Elopement  Goal: Patient will not exit the unit/facility without proper excort  09/07/2023 0931 by Carlin Ehrich, RN  Outcome: Progressing  09/07/2023 0702 by Adrien Pile, RN  Outcome: Progressing     Problem: ABCDS Injury Assessment  Goal: Absence of physical injury  09/07/2023 0931 by Carlin Ehrich, RN  Outcome: Progressing  09/07/2023 0702 by Adrien Pile, RN  Outcome: Progressing     Problem: Discharge Planning  Goal: Discharge to home or other facility with appropriate resources  09/07/2023 0931 by Carlin Ehrich, RN  Outcome: Progressing  09/07/2023 0702 by Adrien Pile, RN  Outcome: Progressing  Flowsheets (Taken 09/06/2023 1955)  Discharge to home or other facility with appropriate resources: Identify barriers to discharge with patient and caregiver     Problem: Seizure Precautions  Goal: Remains free of injury related to seizures activity  09/07/2023 0931 by Carlin Ehrich, RN  Outcome: Progressing  Flowsheets (Taken 09/07/2023 0820)  Remains free of injury related to seizure activity: Maintain airway, patient safety  and administer oxygen as ordered  09/07/2023 0702 by Adrien Pile, RN  Outcome: Progressing     Problem: Safety - Adult  Goal: Free from fall injury  09/07/2023 0931 by Carlin Ehrich, RN  Outcome: Progressing  09/07/2023 0702 by Adrien Pile, RN  Outcome: Progressing     Problem: Pain  Goal: Verbalizes/displays adequate comfort level or baseline comfort level  09/07/2023 0931 by Carlin Ehrich, RN  Outcome: Progressing  09/07/2023 0702 by Adrien Pile, RN  Outcome: Progressing  Flowsheets (Taken 09/06/2023 1730 by Regino Duval, RN)  Verbalizes/displays adequate comfort level or baseline comfort level: Assess pain using appropriate pain scale

## 2023-09-08 LAB — CBC
Hematocrit: 45.8 % (ref 36.6–50.3)
Hemoglobin: 15.9 g/dL (ref 12.1–17.0)
MCH: 32.4 pg (ref 26.0–34.0)
MCHC: 34.7 g/dL (ref 30.0–36.5)
MCV: 93.3 FL (ref 80.0–99.0)
MPV: 10 FL (ref 8.9–12.9)
Nucleated RBCs: 0 /100{WBCs}
Platelets: 323 K/uL (ref 150–400)
RBC: 4.91 M/uL (ref 4.10–5.70)
RDW: 13.9 % (ref 11.5–14.5)
WBC: 7.9 K/uL (ref 4.1–11.1)
nRBC: 0 K/uL (ref 0.00–0.01)

## 2023-09-08 LAB — MAGNESIUM: Magnesium: 2 mg/dL (ref 1.6–2.6)

## 2023-09-08 LAB — COMPREHENSIVE METABOLIC PANEL
ALT: 826 U/L — ABNORMAL HIGH (ref 10–50)
AST: 473 U/L — ABNORMAL HIGH (ref 10–50)
Albumin/Globulin Ratio: 1 — ABNORMAL LOW (ref 1.1–2.2)
Albumin: 3.5 g/dL (ref 3.5–5.2)
Alk Phosphatase: 176 U/L — ABNORMAL HIGH (ref 40–129)
Anion Gap: 14 mmol/L (ref 2–14)
BUN: 12 mg/dL (ref 6–20)
CO2: 18 mmol/L — ABNORMAL LOW (ref 20–29)
Calcium: 9.3 mg/dL (ref 8.6–10.0)
Chloride: 104 mmol/L (ref 98–107)
Creatinine: 0.97 mg/dL (ref 0.70–1.20)
Est, Glom Filt Rate: >90 ml/min/1.73m2 (ref >90)
Globulin: 3.6 g/dL (ref 2.0–4.0)
Glucose: 87 mg/dL (ref 65–100)
Potassium: 4.6 mmol/L (ref 3.5–5.1)
Sodium: 136 mmol/L (ref 136–145)
Total Bilirubin: 0.5 mg/dL (ref 0.0–1.2)
Total Protein: 7.1 g/dL (ref 6.4–8.3)

## 2023-09-08 LAB — CULTURE, BLOOD 2: Culture: NO GROWTH

## 2023-09-08 LAB — LIPASE: Lipase: 42 U/L (ref 13–60)

## 2023-09-08 LAB — CYTOMEGALOVIRUS (CMV) BY PCR: CMV, DNA Probe: NEGATIVE [IU]/mL

## 2023-09-08 LAB — PHOSPHORUS: Phosphorus: 4.2 mg/dL (ref 2.5–4.5)

## 2023-09-08 LAB — CULTURE, BLOOD 1: Culture: NO GROWTH

## 2023-09-08 LAB — CERULOPLASMIN: Ceruloplasmin: 31.8 mg/dL — ABNORMAL HIGH (ref 16.0–31.0)

## 2023-09-08 LAB — ALPHA-1-ANTITRYPSIN: A-1 Antitrypsin: 196 mg/dL — ABNORMAL HIGH (ref 95–164)

## 2023-09-08 NOTE — Plan of Care (Signed)
 Problem: Risk for Elopement  Goal: Patient will not exit the unit/facility without proper excort  Outcome: Progressing     Problem: ABCDS Injury Assessment  Goal: Absence of physical injury  Outcome: Progressing     Problem: Discharge Planning  Goal: Discharge to home or other facility with appropriate resources  Outcome: Progressing  Flowsheets (Taken 09/07/2023 2000)  Discharge to home or other facility with appropriate resources: Identify barriers to discharge with patient and caregiver     Problem: Seizure Precautions  Goal: Remains free of injury related to seizures activity  Outcome: Progressing  Flowsheets (Taken 09/07/2023 2000)  Remains free of injury related to seizure activity: Maintain airway, patient safety  and administer oxygen as ordered     Problem: Safety - Adult  Goal: Free from fall injury  Outcome: Progressing     Problem: Pain  Goal: Verbalizes/displays adequate comfort level or baseline comfort level  Outcome: Progressing

## 2023-09-08 NOTE — Progress Notes (Signed)
 [name]                                                                                          Hospitalist Progress Note  Hildegard Pizza, MD  Answering service: [phone] OR [phone] from in house phone        Date of Service:  09/08/2023  NAME:  Adam Bell  DOB:  [birth year]  MRN:  [id number]       Admission Summary:   Adam Bell is a 38 y.o. male with past medical history of anxiety, depression, alcohol dependence presented to the ED with chief complaint of alcohol intoxication.  Patient reportedly drinks 2 large bottles of wine per day with last drink about 2 hours prior to admission.  He reportedly was tremulous, having headaches, nausea, pins-and-needles feeling in hands and feet.  He reportedly had used Ativan , Librium , Valium  on with concern for alcohol withdrawal without relief of symptoms.  Symptoms noted remain constant, severe, without specific alleviating factors.  Patient was last hospitalized at [facility] from 08/07/2023 to 08/08/2023 with diagnosis of alcohol intoxication and relapsed alcoholic.  Patient reported was discharged with substance use rehabilitation program with plan for Librium  taper in addition to naltrexone with scheduled appointment to an addiction medicine clinic on 08/12/2023.  On arrival to ED, initial reported vital signs were temperature 98.0 F, BP 110/78, heart rate 75, respiratory rate 16, O2 saturation 95% on room air.  Abnormal labs included alkaline phosphatase 138, ALT 371, AST 296, lipase 229, alcohol level 121.  CT head without IV contrast showed no acute intracranial abnormalities.  CT chest without IV contrast showed mild patchy infiltrate in the right upper lobe and minimal infiltrate in the left upper lobe which may be secondary to aspiration or pneumonia with no acute bony abnormalities.  CT abdomen pelvis without IV contrast showed punctate bilateral renal calculi (nonobstructive),  hepatic steatosis, colonic diverticulosis, fecal stasis, but no acute intraperitoneal process.  Patient was multivitamin, thiamine , folic acid  and started on phenobarbital  taper.  Patient is now seen for admission to the hospitalist service..  Patient was prior history of alcohol withdrawal seizures.   Last drink was 8/08.     Interval history / Subjective:   Withdrawal sx better, still requiring prn benzos     Assessment & Plan:         Alcohol withdrawal syndrome with complication (HCC)  - Ethanol level = 121 mg/dL  - Already started on CIWA alcohol withdrawal protocol with phenobarbital  taper per ED  - Encourage alcohol cessation;  - Multivitamin, thiamine , folic acid  daily supplementation;     2.  Elevated LFTs  Hepatic steatosis  Acute alcoholic hepatitis  - transaminase pattern not c/w etoh, noted hepatology work-up     3.  Elevated lipase --resolved   - Acute pancreatitis, possible but non-tender abdominal exam and no related GI symptoms  - Lipase = 229 --> normalized overnight;  -No abdominal pain, no nausea or vomiting, tolerating diet;     4.  Pneumonia of both lungs due to infectious organism --> ruled out  -  CT chest without IV contrast showed mild patchy infiltrate in the right upper lobe and minimal infiltrate in the left upper lobe which may be secondary to aspiration or pneumonia   - Patient started on empiric antibiotics, but he is afebrile, has no leukocytosis, not coughing or short of breath; discontinue antibiotics and monitor clinically;     5.  Anxiety  Depression  - Resume home medications;     6.  Vaping  - Encouraged to stop vaping     7.  Renal calculi  - Punctate nonobstructive bilateral renal calculi per CT report     8.  Fecal stasis  - Ordered bowel regimen/stool softener       Code status: Full  Prophylaxis: Bilateral SCDs  Care Plan discussed with: Patient, RN, CM;  Anticipated Disposition: home when withdrawal resolves    Inpatient  Cardiac monitoring: Telemetry             Principal  Problem:    Alcohol withdrawal syndrome with complication (HCC)  Active Problems:    Pneumonia of both lungs due to infectious organism    Abnormal liver enzymes  Resolved Problems:    * No resolved hospital problems. *         Social Drivers of Health     Tobacco Use: High Risk (09/04/2023)    Patient History     Smoking Tobacco Use: Former     Smokeless Tobacco Use: Current     Passive Exposure: Past   Alcohol Use: Alcohol Misuse (08/24/2023)    AUDIT-C     Frequency of Alcohol Consumption: 4 or more times a week     Average Number of Drinks: 5 or 6     Frequency of Binge Drinking: Daily or almost daily   Financial Resource Strain: Not on file   Food Insecurity: Not on file (09/04/2023)   Transportation Needs: No Transportation Needs (09/04/2023)    PRAPARE - Therapist, art (Medical): No     Lack of Transportation (Non-Medical): No   Physical Activity: Not on file   Stress: Not on file   Social Connections: Not on file   Intimate Partner Violence: Not on file   Depression: Not on file   Housing Stability: Low Risk  (09/04/2023)    Housing Stability Vital Sign     Unable to Pay for Housing in the Last Year: No     Number of Times Moved in the Last Year: 0     Homeless in the Last Year: No   Interpersonal Safety: Not At Risk (09/04/2023)    Interpersonal Safety Domain Source: IP Abuse Screening     Physical abuse: Denies     Verbal abuse: Denies     Emotional abuse: Denies     Financial abuse: Denies     Sexual abuse: Denies   Utilities: Not At Risk (09/04/2023)    AHC Utilities     Threatened with loss of utilities: No       Review of Systems:   Pertinent items are noted in HPI.       Vital Signs:    Last 24hrs VS reviewed since prior progress note. Most recent are:  Vitals:    09/08/23 1439   BP: 134/85   Pulse: 71   Resp:    Temp: 98.6 F (37 C)   SpO2: 93%         Intake/Output Summary (Last 24 hours) at 09/08/2023 1630  Last  data filed at 09/08/2023 0551  Gross per 24 hour   Intake --    Output 2100 ml   Net -2100 ml        Physical Examination:     I had a face to face encounter with this patient and independently examined them on 09/08/2023 as outlined below:          General : alert x 3, awake, no acute distress,   HEENT: EOMI, moist mucus membrane, anicteric sclerae  Neck: supple, no JVD, no meningeal signs  Chest: Clear to auscultation bilaterally   CVS: S1 S2 heard, Capillary refill less than 2 seconds  Abd: soft/ non tender, non distended, BS physiological,   Ext: no clubbing, no cyanosis, no edema, brisk 2+ DP pulses  Neuro/Psych: pleasant mood and affect, CN 2-12 grossly intact, Strength 5/5 in all extremities, mild tremor  Skin: psoriatic rash    Data Review:    Review and/or order of clinical lab test  Review and/or order of tests in the radiology section of CPT  Review and/or order of tests in the medicine section of CPT      I have personally and independently reviewed all pertinent labs, diagnostic studies, imaging, and have provided independent interpretation of the same.     Labs:     Recent Labs     09/07/23  0504 09/08/23  0531   WBC 7.0 7.9   HGB 15.0 15.9   HCT 46.2 45.8   PLT 297 323     Recent Labs     09/06/23  0845 09/07/23  0504 09/08/23  0531   NA 135* 136 136   K 4.2 4.6 4.6   CL 103 104 104   CO2 23 21 18*   BUN 7 10 12    MG 1.8 1.9 2.0   PHOS 3.6 4.2 4.2     Recent Labs     09/06/23  0845 09/07/23  0504 09/08/23  0531   ALT 934* 830* 826*   GLOB 3.5 3.6 3.6     Recent Labs     09/07/23  0504   INR 1.1      Recent Labs     09/06/23  1555   TIBC 354      No results found for: RBCF   No results for input(s): PH, PCO2, PO2 in the last 72 hours.  No results for input(s): CPK in the last 72 hours.    Invalid input(s): CPKMB, CKNDX, TROIQ  No results found for: CHOL, CHLST, CHOLV, HDL, HDLC, LDL, LDLC  No results found for: GLUCPOC  @LABUA @    Notes reviewed from all clinical/nonclinical/nursing services involved in patient's clinical care. Care  coordination discussions were held with appropriate clinical/nonclinical/ nursing providers based on care coordination needs.         Patients current active Medications were reviewed, considered, added and adjusted based on the clinical condition today.      Home Medications were reconciled to the best of my ability given all available resources at the time of admission. Route is PO if not otherwise noted.      Medications Reviewed:     Current Facility-Administered Medications   Medication Dose Route Frequency    LORazepam  (ATIVAN ) tablet 1 mg  1 mg Oral Q1H PRN    Or    diazePAM  (VALIUM ) injection 5 mg  5 mg IntraVENous Q1H PRN    Or    LORazepam  (ATIVAN ) tablet 2 mg  2 mg  Oral Q1H PRN    Or    diazePAM  (VALIUM ) injection 10 mg  10 mg IntraVENous Q1H PRN    Or    LORazepam  (ATIVAN ) tablet 3 mg  3 mg Oral Q1H PRN    Or    diazePAM  (VALIUM ) injection 15 mg  15 mg IntraVENous Q1H PRN    Or    LORazepam  (ATIVAN ) tablet 4 mg  4 mg Oral Q1H PRN    Or    diazePAM  (VALIUM ) injection 20 mg  20 mg IntraVENous Q1H PRN    sertraline  (ZOLOFT ) tablet 50 mg  50 mg Oral Daily    sodium chloride  flush 0.9 % injection 5-40 mL  5-40 mL IntraVENous 2 times per day    sodium chloride  flush 0.9 % injection 5-40 mL  5-40 mL IntraVENous PRN    0.9 % sodium chloride  infusion   IntraVENous PRN    thiamine  tablet 100 mg  100 mg Oral Daily    folic acid  (FOLVITE ) tablet 1 mg  1 mg Oral Daily    multivitamin 1 tablet  1 tablet Oral Daily    sodium chloride  flush 0.9 % injection 5-40 mL  5-40 mL IntraVENous 2 times per day    sodium chloride  flush 0.9 % injection 5-40 mL  5-40 mL IntraVENous PRN    0.9 % sodium chloride  infusion   IntraVENous PRN    ondansetron  (ZOFRAN -ODT) disintegrating tablet 4 mg  4 mg Oral Q8H PRN    Or    ondansetron  (ZOFRAN ) injection 4 mg  4 mg IntraVENous Q6H PRN    polyethylene glycol (GLYCOLAX ) packet 17 g  17 g Oral Daily PRN    enoxaparin  (LOVENOX ) injection 40 mg  40 mg SubCUTAneous Daily      ______________________________________________________________________  EXPECTED LENGTH OF STAY: Unable to retrieve estimated LOS  ACTUAL LENGTH OF STAY:          6                 Wendie Diskin Al-Saadoon, MD

## 2023-09-08 NOTE — Plan of Care (Signed)
 Problem: Risk for Elopement  Goal: Patient will not exit the unit/facility without proper excort  09/08/2023 1010 by Carlin Ehrich, RN  Outcome: Progressing  09/08/2023 0324 by Adrien Pile, RN  Outcome: Progressing     Problem: ABCDS Injury Assessment  Goal: Absence of physical injury  09/08/2023 1010 by Carlin Ehrich, RN  Outcome: Progressing  09/08/2023 0324 by Adrien Pile, RN  Outcome: Progressing     Problem: Discharge Planning  Goal: Discharge to home or other facility with appropriate resources  09/08/2023 1010 by Carlin Ehrich, RN  Outcome: Progressing  09/08/2023 0324 by Adrien Pile, RN  Outcome: Progressing  Flowsheets (Taken 09/07/2023 2000)  Discharge to home or other facility with appropriate resources: Identify barriers to discharge with patient and caregiver     Problem: Seizure Precautions  Goal: Remains free of injury related to seizures activity  09/08/2023 1010 by Carlin Ehrich, RN  Outcome: Progressing  Flowsheets (Taken 09/08/2023 0830)  Remains free of injury related to seizure activity: Maintain airway, patient safety  and administer oxygen as ordered  09/08/2023 0324 by Adrien Pile, RN  Outcome: Progressing  Flowsheets (Taken 09/07/2023 2000)  Remains free of injury related to seizure activity: Maintain airway, patient safety  and administer oxygen as ordered     Problem: Safety - Adult  Goal: Free from fall injury  09/08/2023 1010 by Carlin Ehrich, RN  Outcome: Progressing  09/08/2023 0324 by Adrien Pile, RN  Outcome: Progressing     Problem: Pain  Goal: Verbalizes/displays adequate comfort level or baseline comfort level  09/08/2023 1010 by Carlin Ehrich, RN  Outcome: Progressing  09/08/2023 0324 by Adrien Pile, RN  Outcome: Progressing

## 2023-09-08 NOTE — Progress Notes (Signed)
 [facility]   [facility]  [address]  [place], [place]  [address]  [phone]  FAX: [phone]     Merilee LITTIE Furl, MD, FACP, [place], FAASLD   Delon KATHEE Pinal, MD     Lauraine Canton, PA-C    April S Ashworth, AGPCNP-BC   Asberry Amy, ACNPC-A    Liver Transplant and Liver Cancer Coordination:   Thedford Chaco, RN   Suzen Birmingham, RN   Ines Holder, RN    Clinical Research Coordination:   Lauraine Sinclair, RN   Leita Deems, RN   Suzen Fillers, RN   Delon Lesser, RN         HEPATOLOGY NOTE      HISTORY OF PRESENT ILLNESS:  The LIV team was asked to see this patient in consultation for evaluation and management of elevated liver enzymes.    The patient is a 38 y.o. male with pmhx of psoriasis, polysubstance abuse (heroin and cocaine), and alcohol use disorder. Patient reports heavy alcohol use almost daily for ~15 years. He has had numerous detoxes and is currently admitted for detox and his goal is to transition to in patient rehab.     Hepatology consulted due to elevated liver enzymes. Since admission, his liver enzymes have increased, to AST 712, ALT 934 and Alk Phos 164 on day of consultation.     Patient reports he was previously told he may have HCV while living at a half way house. He has never seen a hepatologist in clinic due to lack of insurance. He was previously admitted to [facility] for alcohol withdrawal and was told his liver enzyme elevation was related to alcohol.  He denies history of jaundice, hematemasis, hematochezia, melena, ascites, or confusion.       He is currently being treated with phenobarbital , valium  and ativan  for alcohol withdrawal.    Labs on presentation were notable for AST 296, ALT 371, Alk Phos 138 and t. Bili 0.3. He underwent non contrasted abdominal CT scan which showed hepatic steatosis and normal spleen    Hospital Course to date:  See above for course prior to my consultation.     Hepatology Plan:    CIWA per primary  team    HCV RNA pending    Daily liver enzymes + INR. If enzymes continue to trend down tomorrow, okay with discharge from hepatology perspective and close follow up in clinic. He will need HCV treatment as well which will be addressed in clinic.     ASSESSMENT AND PLAN:  38 y.o. male with pmhx of psoriasis, polysubstance abuse (heroin and cocaine), and alcohol use disorder. Patient reports heavy alcohol use almost daily for ~15 years. He has had numerous detoxes and is currently admitted for detox and his goal is to transition to in patient rehab.       Abnormal liver enzymes  He has a hepatocellular injury pattern that is NOT consistent with alcohol injury. Alcohol typically causes an AST:ALT 2:1 pattern and does not reach levels this high  Enzymes in the thousand range are due to the following: viral causes, autoimmune hepatiits, drug induced liver injury, ischemia and rarely biliary obstruction    His history of psoriasis increases my suspicion for AIH, however serologies are negative  US  shows no stones or biliary pathology and only hepatic steatosis    Serologies : ANA, AMA, HBV negative. HCV ab+, RNA Pending    There is NO signs of liver failure.Mentation is normal  Liver enzymes slowly improving. Etiology unclear- ETOH and HCV?      SYSTEM REVIEW:  Constitution systems: Negative for fever, chills, weight gain, weight loss.   Eyes: Negative for visual changes.  ENT: Negative for sore throat, painful swallowing.   Respiratory: Negative for cough, hemoptysis, SOB.   Cardiology: Negative for chest pain, palpitations.  GI:  Negative for constipation or diarrhea.  GU: Negative for urinary frequency, dysuria, hematuria, nocturia.   Skin: Negative for rash.  Hematology: Negative for easy bruising, blood clots.    Musculo-skelatal: Negative for back pain, muscle pain, weakness.  Neurologic: Negative for headaches, dizziness, vertigo, memory problems not related to HE.  Psychology: Negative for anxiety, depression.        FAMILY HISTORY:  No known family history of liver disease.  Father- alcoholic    SOCIAL HISTORY:  Tobacco-  Alcohol- 750 mL vodka daily  Drug- hx of cocaine and IVDU  Works as Engineer, site  Children who live with their mother  Has GF. Lives with mom      PHYSICAL EXAMINATION:  VS: BP 134/85   Pulse 69   Temp 98.6 F (37 C) (Oral)   Resp 16   Ht 1.803 m (5' 11)   Wt 84.8 kg (186 lb 14.4 oz)   SpO2 93%   BMI 26.07 kg/m     General:    No acute distress.   Eyes:    Sclera anicteric.   ENT:  No oral lesions.  Thyroid normal.  Nodes:  No adenopathy.   Skin:    No spider angiomata.  No jaundice.  No palmar erythema.  Psoriatic like rash on forehead  Respiratory:    Lungs clear to auscultation.   Cardiovascular:    Regular heart rate.  Abdomen:    Soft non-tender,  Extremities:    No lower extremity edema.    No muscle wasting.  No gross arthritic changes.  Neurologic:    Alert and oriented.      LABORATORY:  Lab Results   Component Value Date    ALT 826 (H) 09/08/2023    AST 473 (H) 09/08/2023    ALKPHOS 176 (H) 09/08/2023    BILITOT 0.5 09/08/2023     No results found for: LABPROT, LABALBU  Lab Results   Component Value Date    WBC 7.9 09/08/2023    HGB 15.9 09/08/2023    HCT 45.8 09/08/2023    MCV 93.3 09/08/2023    PLT 323 09/08/2023     Lab Results   Component Value Date    INR 1.1 09/07/2023    PROTIME 11.2 09/07/2023     Lab Results   Component Value Date    BUN 12 09/08/2023     Lab Results   Component Value Date    CREATININE 0.97 09/08/2023          09/02/23 19:04 09/03/23 06:14 09/04/23 04:18 09/06/23 08:45   Albumin 3.5 2.8 (L) 3.1 (L) 3.2 (L)   Globulin 4.4 (H) 3.5 3.6 3.5   Albumin/Globulin Ratio 0.8 (L) 0.8 (L) 0.9 (L) 0.9 (L)   Alkaline Phosphatase 138 (H) 114 130 (H) 164 (H)   ALT 371 (H) 336 (H) 469 (H) 934 (H)   Ammonia  30     AST 296 (H) 258 (H) 373 (H) 712 (H)   Total Bilirubin 0.3 0.4 0.4 0.6         Delon Pinal, MD

## 2023-09-09 MED ORDER — CHLORDIAZEPOXIDE HCL 10 MG PO CAPS
10 | Freq: Three times a day (TID) | ORAL | Status: DC
Start: 2023-09-09 — End: 2023-09-09
  Administered 2023-09-09: 20:00:00 10 mg via ORAL

## 2023-09-09 MED ORDER — ONDANSETRON 4 MG PO TBDP
4 | ORAL_TABLET | Freq: Three times a day (TID) | ORAL | 0 refills | 6.00000 days | Status: AC | PRN
Start: 2023-09-09 — End: ?

## 2023-09-09 MED ORDER — ONDANSETRON 4 MG PO TBDP
4 | ORAL_TABLET | Freq: Three times a day (TID) | ORAL | 0 refills | 6.00000 days | Status: DC | PRN
Start: 2023-09-09 — End: 2023-09-09

## 2023-09-09 MED ORDER — CHLORDIAZEPOXIDE HCL 5 MG PO CAPS
5 | ORAL_CAPSULE | ORAL | 0 refills | 18.50000 days | Status: DC
Start: 2023-09-09 — End: 2023-09-09

## 2023-09-09 MED ORDER — CHLORDIAZEPOXIDE HCL 5 MG PO CAPS
5 | ORAL_CAPSULE | ORAL | 0 refills | 18.50000 days | Status: AC
Start: 2023-09-09 — End: 2023-09-14

## 2023-09-09 MED FILL — CHLORDIAZEPOXIDE HCL 10 MG PO CAPS: 10 mg | ORAL | Qty: 1 | Fill #0

## 2023-09-09 NOTE — Plan of Care (Signed)
 Problem: Risk for Elopement  Goal: Patient will not exit the unit/facility without proper excort  Outcome: Progressing     Problem: ABCDS Injury Assessment  Goal: Absence of physical injury  Outcome: Progressing     Problem: Discharge Planning  Goal: Discharge to home or other facility with appropriate resources  Outcome: Progressing  Flowsheets (Taken 09/08/2023 2000)  Discharge to home or other facility with appropriate resources: Identify barriers to discharge with patient and caregiver     Problem: Seizure Precautions  Goal: Remains free of injury related to seizures activity  Outcome: Progressing     Problem: Safety - Adult  Goal: Free from fall injury  Outcome: Progressing     Problem: Pain  Goal: Verbalizes/displays adequate comfort level or baseline comfort level  Outcome: Progressing

## 2023-09-09 NOTE — Discharge Summary (Signed)
 Discharge Summary       PATIENT ID: Adam Bell  MRN: [id number]   DATE OF BIRTH: 05/23/1985    DATE OF ADMISSION: 09/02/2023  8:01 PM    DATE OF DISCHARGE: 09/09/2023   PRIMARY CARE PROVIDER: Pearley Rush, MD     ATTENDING PHYSICIAN: Al-Saadoon  DISCHARGING PROVIDER: Hildegard Pizza, MD    To contact this individual call [phone] and ask the operator to page.  If unavailable ask to be transferred the Adult Hospitalist Department.    CONSULTATIONS: IP CONSULT TO SOCIAL WORK  IP CONSULT TO HOSPITALIST  IP CONSULT TO HEPATOLOGY    PROCEDURES/SURGERIES: * No surgery found *     ADMITTING DIAGNOSES & HOSPITAL COURSE:   Alcohol use disorder with withdrawal  Elevated hepatic transaminases  Anxiety  Renal calculi    38 y.o. male with past medical history of anxiety, depression, alcohol dependence presented to the ED with chief complaint of alcohol intoxication. Patient reportedly drinks 2 large bottles of wine per day with last drink about 2 hours prior to admission. He reportedly was tremulous, having headaches, nausea, pins-and-needles feeling in hands and feet. He reportedly had used Ativan , Librium , Valium  on with concern for alcohol withdrawal without relief of symptoms. Symptoms noted remain constant, severe, without specific alleviating factors. Patient was last hospitalized at [facility] from 08/07/2023 to 08/08/2023     -treated with phenobarb taper and PRN benzos  -discharged on librium  taper, states it worked well for him in the past  -plans to go directly to rehab house      -elevated LFTs with pattern not c/w alcoholic liver injury. Hepatology consulted, work-up unrevealing so far. 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 Improved prior to discharge    DISCHARGE DIAGNOSES / PLAN:      FOLLOW UP APPOINTMENTS:    Follow-up Information       Follow up With Specialties Details Why Contact Info    Jama Fireman, MD Family Medicine Follow up Hospital follow-up NEW PCP transitional care appointment has been scheduled   for Tuesday, September 9th, 2025 at 9:30 a.m. Please plan to arrive 15 min early with Insurance card, ID, List of medication. You may also log onto [facility] and complete this information prior to visit.  PLEASE CONTACT PROVIDER if you are unable to keep this appointment. [address]  [place] [address]  [phone]      Cleotilde Nest, MD Gastroenterology, Transplant Hepatology Follow up  [address]  [address] [place] [address]  [phone]                DISCHARGE MEDICATIONS:     Medication List        START taking these medications      chlordiazePOXIDE  5 MG capsule  Commonly known as: LIBRIUM   Take 2 capsules by mouth 3 times daily for 2 days, THEN 1 capsule 3 times daily for 2 days, THEN 1 capsule in the morning and at bedtime for 1 day. Max Daily Amount: 30 mg.  Start taking on: September 09, 2023            CONTINUE taking these medications      cloNIDine 0.1 MG tablet  Commonly known as: CATAPRES     hydrOXYzine pamoate 50 MG capsule  Commonly known as: VISTARIL     propranolol 10 MG immediate release tablet  Commonly known as: INDERAL     sertraline  100 MG tablet  Commonly known  as: ZOLOFT             ASK your doctor about these medications      traZODone  100 MG tablet  Commonly known as: DESYREL                Where to Get Your Medications        These medications were sent to [facility] [address], [place] - [address] - P [phone] [facility] [phone]  [address], [place] [address]      Phone: [phone]   chlordiazePOXIDE  5 MG capsule           PHYSICAL EXAMINATION AT DISCHARGE:    S: seen and examined, feels better, tolerating diet, wants to get to rehab. States he took diazepam  this morning due to an argument with his ex and not etoh withdrawal    BP 126/86   Pulse 95   Temp 98.4 F (36.9 C) (Oral)   Resp 14   Ht 1.803 m (5' 11)   Wt 84.8 kg (186 lb 14.4 oz)   SpO2 96%   BMI 26.07 kg/m     NAD  RRR  Lungs CTAB      CHRONIC MEDICAL  DIAGNOSES:  Principal Problem:    Alcohol withdrawal syndrome with complication (HCC)  Active Problems:    Pneumonia of both lungs due to infectious organism    Abnormal liver enzymes  Resolved Problems:    * No resolved hospital problems. *      Greater than 30 minutes spent on discharge management.    Signed:   Jamayia Croker Al-Saadoon, MD  09/09/2023  3:00 PM

## 2023-09-09 NOTE — Discharge Instructions (Signed)
 Liver function test re-check on follow-up.

## 2023-09-28 NOTE — Progress Notes (Signed)
 DC from [facility].  Need fu for HCV and tx if RNA +.  Can see Lauraine Abundio Pinal or MLS.  Received: 2 days ago  Renny, Merilee CROME, MD  Moss-Blowe, Courtney Gloriann Stabs, APRN - NP; Veverly Lauraine NOVAK, [place]; Pinal Nest, MD  Can enter [facility] clinical trial for HCV treatment as well.  MLS

## 2023-09-28 NOTE — Progress Notes (Signed)
 1st attempt to schedule with  either Provider 1st available left voice mail

## 2023-10-04 ENCOUNTER — Ambulatory Visit: Payer: MEDICAID | Attending: Student in an Organized Health Care Education/Training Program | Primary: Internal Medicine

## 2024-02-08 NOTE — Progress Notes (Signed)
 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

## 2024-02-11 ENCOUNTER — Inpatient Hospital Stay
Admit: 2024-02-11 | Discharge: 2024-02-11 | Disposition: A | Discharge status: Home / Self Care | Payer: Medicaid (Managed Care) | Arrived: VH | Attending: Student in an Organized Health Care Education/Training Program

## 2024-02-11 ENCOUNTER — Emergency Department: Admit: 2024-02-11 | Payer: Medicaid (Managed Care) | Primary: Internal Medicine

## 2024-02-11 DIAGNOSIS — N132 Hydronephrosis with renal and ureteral calculous obstruction: Secondary | ICD-10-CM

## 2024-02-11 DIAGNOSIS — N200 Calculus of kidney: Principal | ICD-10-CM

## 2024-02-11 LAB — CBC WITH AUTO DIFFERENTIAL
Basophils %: 1.7 % — ABNORMAL HIGH (ref 0.0–1.0)
Basophils Absolute: 0.13 K/UL — ABNORMAL HIGH (ref 0.00–0.10)
Eosinophils %: 5.5 % (ref 0.0–7.0)
Eosinophils Absolute: 0.41 K/UL — ABNORMAL HIGH (ref 0.00–0.40)
Hematocrit: 48.4 % (ref 36.6–50.3)
Hemoglobin: 17.3 g/dL — ABNORMAL HIGH (ref 12.1–17.0)
Immature Granulocytes %: 0.4 % (ref 0.0–0.5)
Immature Granulocytes Absolute: 0.03 K/UL (ref 0.00–0.04)
Lymphocytes %: 43.5 % (ref 12.0–49.0)
Lymphocytes Absolute: 3.26 K/UL (ref 0.80–3.50)
MCH: 30.7 pg (ref 26.0–34.0)
MCHC: 35.7 g/dL (ref 30.0–36.5)
MCV: 85.8 FL (ref 80.0–99.0)
MPV: 10 FL (ref 8.9–12.9)
Monocytes %: 6.3 % (ref 5.0–13.0)
Monocytes Absolute: 0.47 K/UL (ref 0.00–1.00)
Neutrophils %: 42.6 % (ref 32.0–75.0)
Neutrophils Absolute: 3.2 K/UL (ref 1.80–8.00)
Nucleated RBCs: 0 /100{WBCs}
Platelets: 355 K/uL (ref 150–400)
RBC: 5.64 M/uL (ref 4.10–5.70)
RDW: 12 % (ref 11.5–14.5)
WBC: 7.5 K/uL (ref 4.1–11.1)
nRBC: 0 K/uL (ref 0.00–0.01)

## 2024-02-11 LAB — URINALYSIS WITH MICROSCOPIC
BACTERIA, URINE: NEGATIVE /HPF
Glucose, Ur: NEGATIVE mg/dL
Leukocyte Esterase, Urine: NEGATIVE
Nitrite, Urine: NEGATIVE
Protein, UA: 30 mg/dL — AB
Specific Gravity, UA: 1.02 (ref 1.003–1.030)
Urobilinogen, Urine: 1 EU/dL (ref 0.2–1.0)
pH, Urine: 6.5 (ref 5.0–8.0)

## 2024-02-11 LAB — URINE DRUG SCREEN
Amphetamine, Urine: NEGATIVE
Barbiturates, Urine: NEGATIVE
Benzodiazepines, Urine: NEGATIVE
Cocaine, Urine: NEGATIVE
Fentanyl: NEGATIVE
Methadone, Urine: NEGATIVE
Opiates, Urine: NEGATIVE
Phencyclidine, Urine: NEGATIVE
THC, TH-Cannabinol, Urine: POSITIVE — AB

## 2024-02-11 LAB — EXTRA TUBES HOLD

## 2024-02-11 LAB — COMPREHENSIVE METABOLIC PANEL
ALT: 82 U/L — ABNORMAL HIGH (ref 10–50)
AST: 57 U/L — ABNORMAL HIGH (ref 10–50)
Albumin/Globulin Ratio: 1 — ABNORMAL LOW (ref 1.1–2.2)
Albumin: 4.4 g/dL (ref 3.5–5.2)
Alk Phosphatase: 86 U/L (ref 40–129)
Anion Gap: 16 mmol/L — ABNORMAL HIGH (ref 2–14)
BUN/Creatinine Ratio: 11 — ABNORMAL LOW (ref 12–20)
BUN: 10 mg/dL (ref 6–20)
CO2: 23 mmol/L (ref 20–29)
Calcium: 10.4 mg/dL — ABNORMAL HIGH (ref 8.6–10.0)
Chloride: 100 mmol/L (ref 98–107)
Creatinine: 0.95 mg/dL (ref 0.70–1.20)
Est, Glom Filt Rate: >90 ml/min/1.73m2 (ref >59)
Globulin: 4.4 g/dL — ABNORMAL HIGH (ref 2.0–4.0)
Glucose: 103 mg/dL — ABNORMAL HIGH (ref 65–100)
Potassium: 3.2 mmol/L — ABNORMAL LOW (ref 3.5–5.1)
Sodium: 139 mmol/L (ref 136–145)
Total Bilirubin: 0.4 mg/dL (ref 0.0–1.2)
Total Protein: 8.8 g/dL — ABNORMAL HIGH (ref 6.4–8.3)

## 2024-02-11 LAB — BILIRUBIN, CONFIRMATORY: Bilirubin Confirmation, UA: NEGATIVE

## 2024-02-11 LAB — LIPASE: Lipase: 31 U/L (ref 13–60)

## 2024-02-11 LAB — URINE CULTURE HOLD SAMPLE

## 2024-02-11 LAB — MAGNESIUM: Magnesium: 1.9 mg/dL (ref 1.6–2.6)

## 2024-02-11 MED ORDER — TAMSULOSIN HCL 0.4 MG PO CAPS
0.4 | ORAL_CAPSULE | Freq: Every day | ORAL | 0 refills | 30.00000 days | Status: DC
Start: 2024-02-11 — End: 2024-02-11

## 2024-02-11 MED ORDER — KETOROLAC TROMETHAMINE 30 MG/ML IJ SOLN
30 | INTRAMUSCULAR | Status: AC
Start: 2024-02-11 — End: 2024-02-11
  Administered 2024-02-11: 10:00:00 15 mg via INTRAVENOUS

## 2024-02-11 MED ORDER — KETOROLAC TROMETHAMINE 10 MG PO TABS
10 | ORAL_TABLET | Freq: Four times a day (QID) | ORAL | 0 refills | 5.00000 days | Status: DC | PRN
Start: 2024-02-11 — End: 2024-02-11

## 2024-02-11 MED ORDER — ONDANSETRON 4 MG PO TBDP
4 | ORAL_TABLET | Freq: Three times a day (TID) | ORAL | 0 refills | 7.00000 days | Status: AC | PRN
Start: 2024-02-11 — End: ?

## 2024-02-11 MED ORDER — ACETAMINOPHEN 500 MG PO TABS
500 | ORAL_TABLET | Freq: Four times a day (QID) | ORAL | 0 refills | 10.00000 days | Status: DC | PRN
Start: 2024-02-11 — End: 2024-02-11

## 2024-02-11 MED ORDER — ACETAMINOPHEN 500 MG PO TABS
500 | ORAL_TABLET | Freq: Four times a day (QID) | ORAL | 0 refills | 10.00000 days | Status: AC | PRN
Start: 2024-02-11 — End: 2024-02-18

## 2024-02-11 MED ORDER — ONDANSETRON 4 MG PO TBDP
4 | ORAL_TABLET | Freq: Three times a day (TID) | ORAL | 0 refills | 7.00000 days | Status: DC | PRN
Start: 2024-02-11 — End: 2024-02-11

## 2024-02-11 MED ORDER — KETOROLAC TROMETHAMINE 30 MG/ML IJ SOLN
30 | INTRAMUSCULAR | Status: AC
Start: 2024-02-11 — End: 2024-02-11
  Administered 2024-02-11: 12:00:00 15 mg via INTRAVENOUS

## 2024-02-11 MED ORDER — LACTATED RINGERS IV BOLUS
INTRAVENOUS | Status: AC
Start: 2024-02-11 — End: 2024-02-11
  Administered 2024-02-11: 10:00:00 1000 mL via INTRAVENOUS

## 2024-02-11 MED ORDER — LACTATED RINGERS IV BOLUS
INTRAVENOUS | Status: AC
Start: 2024-02-11 — End: 2024-02-11
  Administered 2024-02-11: 11:00:00 1000 mL via INTRAVENOUS

## 2024-02-11 MED ORDER — POTASSIUM CHLORIDE ER 10 MEQ PO TBCR
10 | Freq: Once | ORAL | Status: AC
Start: 2024-02-11 — End: 2024-02-11
  Administered 2024-02-11: 11:00:00 40 meq via ORAL

## 2024-02-11 MED ORDER — TAMSULOSIN HCL 0.4 MG PO CAPS
0.4 | ORAL_CAPSULE | Freq: Every day | ORAL | 0 refills | 30.00000 days | Status: AC
Start: 2024-02-11 — End: 2024-02-25

## 2024-02-11 MED ORDER — ONDANSETRON HCL 4 MG/2ML IJ SOLN
4 | Freq: Once | INTRAMUSCULAR | Status: AC
Start: 2024-02-11 — End: 2024-02-11
  Administered 2024-02-11: 10:00:00 4 mg via INTRAVENOUS

## 2024-02-11 MED ORDER — KETOROLAC TROMETHAMINE 10 MG PO TABS
10 | ORAL_TABLET | Freq: Four times a day (QID) | ORAL | 0 refills | 5.00000 days | Status: AC | PRN
Start: 2024-02-11 — End: 2024-02-18

## 2024-02-11 MED FILL — POTASSIUM CHLORIDE ER 10 MEQ PO TBCR: 10 meq | ORAL | Qty: 4 | Fill #0

## 2024-02-11 MED FILL — KETOROLAC TROMETHAMINE 30 MG/ML IJ SOLN: 30 mg/mL | INTRAMUSCULAR | Qty: 1 | Fill #0

## 2024-02-11 MED FILL — LACTATED RINGERS IV SOLN: INTRAVENOUS | Qty: 1000 | Fill #0

## 2024-02-11 MED FILL — ONDANSETRON HCL 4 MG/2ML IJ SOLN: 4 MG/2ML | INTRAMUSCULAR | Qty: 2 | Fill #0

## 2024-02-11 NOTE — ED Provider Notes (Signed)
 [facility]  EMERGENCY DEPARTMENT ENCOUNTER      Pt Name: Adam Bell  MRN: [id number]  Birthdate 11-26-1985  Date of evaluation: 02/11/2024  Provider: Daved Pinna, Adam Bell    CHIEF COMPLAINT       Chief Complaint   Patient presents with    Constipation       PMH No past medical history on file.      MDM:   Vitals:    Vitals:    02/11/24 0445   BP: (!) 154/102   Pulse: 52   Resp: 19   Temp: 97.5 F (36.4 C)   SpO2: 99%           This is a 39 y.o. male with pmhx polysubstance abuse who presents today for cc of flank pain . Patient initially had told triage he was concerned for constipation with last BM a few days ago, had ingested chia seeds and dulcolax to try to help. He now states he is not here at all for constipation and is really here for flank pain. Rates his pain 10/10, it is sharp in the right flank and radiating to the groin. No hx kidney stones. No urinary symptoms . Otherwise denies chest pain, dyspnea, fever, chills, vomiting, and diarrhea. Feels very nauseated with the pain.     On arrival VS stable.     Physical Exam  General: Alert, no acute distress  HEENT: Normocephalic, atraumatic. EOMI, moist oral mucosa, no conjunctival injection  Neck: ROM normal, supple  Cardio: Heart regular rate and regular rhythm, cap refill <2seconds  Lungs: no respiratory distress, lungs CTAB, no wheezes, rhonchi or rales   Abdomen: abdomen soft, nontender, no CVA TTP b/l  MSK:ROM normal, no LE edema  Skin: Warm, dry, no rash  Neuro: No focal neurodeficits, AOx3     Patient given zofran , toradol , fluids for symptom control.     Labs show mild hematuria without infection,  mild hypokalemia that was repleted. CT showing 2mm ureterolithiasis with hydronephrosis consistent with kidney stone.     Patient reassessed, feeling improved. Pain down to 7/10. Will give additional dose of toradol  and fluids.     Patient reassessed, feeling back to baseline. Given polysubstance abuse history  discussed holding narcotic rx and close return precautions should pain not be controlled by toradol /tylenol . Work note provided as requested and they will call the kidney stone hotline. Patient agreeable with discharge. Low suspicion for more sinister process given benign exam, reassuring workup, and resolved symptoms. Will have them follow up, close return precautions discussed, they are agreeable.     Diagnosis is kidney stone, hypokalemia. Disposition is Discharge with PCP and urology follow-up. Workup and plan discussed with patient  and family , all questions answered and they are in agreement with the  plan .             ED Course as of 02/11/24 9367   Sat Feb 11, 2024   0628 EKG rate 42bpm, sinus rhythm, no STEMI [MG]      ED Course User Index  [MG] Pinna Daved, Adam Bell         Total critical care time (not including time spent performing separately reportable procedures):         Review of external notes and Independent historians utilized in decision making: External notes reviewed includeOutside medication list     Diagnostics independently interpreted by me: EKG see ED course for interpretation    Discussions with other clinicians and  healthcare agents:  None    Risks considered in patient's treatment plan: IV medications given (see MAR) and Prescription drug management - Rx toradol , tylenol , flomax , zofran         HISTORY OF PRESENT ILLNESS   (Location/Symptom, Timing/Onset, Context/Setting, Quality, Duration, Modifying Factors, Severity)  Note limiting factors.   See MDM    Nursing Notes were reviewed.    REVIEW OF SYSTEMS    (2-9 systems for level 4, 10 or more for level 5)   See MDM    PAST MEDICAL HISTORY   No past medical history on file.    SURGICAL HISTORY     No past surgical history on file.    CURRENT MEDICATIONS       Previous Medications    CLONIDINE (CATAPRES) 0.1 MG TABLET    Take 1 tablet by mouth as needed in the morning and 1 tablet as needed in the evening.    HYDROXYZINE PAMOATE  (VISTARIL) 50 MG CAPSULE        ONDANSETRON  (ZOFRAN -ODT) 4 MG DISINTEGRATING TABLET    Take 1 tablet by mouth every 8 hours as needed for Nausea or Vomiting    PROPRANOLOL (INDERAL) 10 MG IMMEDIATE RELEASE TABLET    Take 1 tablet by mouth 2 times daily    SERTRALINE  (ZOLOFT ) 100 MG TABLET    Take 0.5 tablets by mouth daily    TRAZODONE  (DESYREL ) 100 MG TABLET    Take 1 tablet by mouth nightly       ALLERGIES     Vancomycin    FAMILY HISTORY     No family history on file.       SOCIAL HISTORY       Social History     Socioeconomic History    Marital status: Single   Tobacco Use    Smoking status: Former     Types: Cigarettes     Passive exposure: Past    Smokeless tobacco: Current     Types: Snuff   Vaping Use    Vaping status: Former     Teacher, Early Years/pre Needs: No Transportation Needs (09/04/2023)    PRAPARE - Therapist, Art (Medical): No     Lack of Transportation (Non-Medical): No   Housing Stability: Low Risk  (09/04/2023)    Housing Stability Vital Sign     Unable to Pay for Housing in the Last Year: No     Number of Times Moved in the Last Year: 0     Homeless in the Last Year: No       PHYSICAL EXAM    (up to 7 for level 4, 8 or more for level 5)     ED Triage Vitals   BP Girls Systolic BP Percentile Girls Diastolic BP Percentile Boys Systolic BP Percentile Boys Diastolic BP Percentile Temp Temp Source Pulse   02/11/24 0413 -- -- -- -- 02/11/24 0413 02/11/24 0413 02/11/24 0413   (!) 161/126     98.3 F (36.8 C) Oral 84      Respirations SpO2 Height Weight - Scale       02/11/24 0413 02/11/24 0413 02/11/24 0445 02/11/24 0445       16 99 % 1.803 m (5' 11) 83.4 kg (183 lb 13.8 oz)           Body mass index is 25.64 kg/m.    Physical Exam    See mdm  DIAGNOSTIC RESULTS     EKG: All EKG's are interpreted by the Emergency Department Physician who either signs or Co-signs this chart in the absence of a cardiologist.        RADIOLOGY:   Non-plain film images  such as CT, Ultrasound and MRI are read by the radiologist.    Interpretation per the Radiologist below, if available at the time of this note:    CT ABDOMEN PELVIS WO CONTRAST Additional Contrast? None   Final Result   A 2 mm calculus at the right ureterovesical junction results in mild right   hydroureteronephrosis.            Electronically signed by Toribio PARAS Musick           LABS:  Labs Reviewed   CBC WITH AUTO DIFFERENTIAL - Abnormal; Notable for the following components:       Result Value    Hemoglobin 17.3 (*)     Basophils % 1.7 (*)     Eosinophils Absolute 0.41 (*)     Basophils Absolute 0.13 (*)     All other components within normal limits   COMPREHENSIVE METABOLIC PANEL - Abnormal; Notable for the following components:    Potassium 3.2 (*)     Anion Gap 16 (*)     Glucose 103 (*)     BUN/Creatinine Ratio 11 (*)     Calcium 10.4 (*)     ALT 82 (*)     AST 57 (*)     Total Protein 8.8 (*)     Globulin 4.4 (*)     Albumin/Globulin Ratio 1.0 (*)     All other components within normal limits   URINALYSIS WITH MICROSCOPIC - Abnormal; Notable for the following components:    Protein, UA 30 (*)     Ketones, Urine TRACE (*)     Blood, Urine LARGE (*)     All other components within normal limits   URINE CULTURE HOLD SAMPLE   EXTRA TUBES HOLD   LIPASE   MAGNESIUM   BILIRUBIN, CONFIRMATORY   URINE DRUG SCREEN       All other labs were within normal range or not returned as of this dictation.        PROCEDURES:  Unless otherwise noted below, none     Procedures    See MDM for documentation of critical care time.       FINAL IMPRESSION    No diagnosis found.      DISPOSITION/PLAN   DISPOSITION        PATIENT REFERRED TO:  No follow-up provider specified.    DISCHARGE MEDICATIONS:  New Prescriptions    No medications on file         (Please note that portions of this note were completed with a voice recognition program.  Efforts were made to edit the dictations but occasionally words are mis-transcribed.)    Adam Denzler, Adam Bell (electronically signed)  Emergency Attending Physician / Physician Assistant / Nurse Practitioner             King Sensor, Adam Bell  02/20/24 1725

## 2024-02-11 NOTE — ED Notes (Signed)
 Family member called requesting new pharmacy.  Prescriptions resent.     Alaiyah Bollman K, PA-C  02/11/24 1655

## 2024-02-11 NOTE — ED Triage Notes (Signed)
 Client reports having constipation over past week. Reports having pressure in rectum and nausea. Reports taking dulcolax and chia seeds.

## 2024-02-12 LAB — EKG 12-LEAD
Atrial Rate: 42 {beats}/min
P Axis: 38 degrees
P-R Interval: 140 ms
Q-T Interval: 452 ms
QRS Duration: 108 ms
QTc Calculation (Bazett): 377 ms
R Axis: 71 degrees
T Axis: 51 degrees
Ventricular Rate: 42 {beats}/min
